# Patient Record
Sex: Male | Born: 1965
Health system: Southern US, Community
[De-identification: ages and names within clinical notes are randomized; demographics above are authoritative.]

## PROBLEM LIST (undated history)

## (undated) DIAGNOSIS — I10 Essential (primary) hypertension: Secondary | ICD-10-CM

## (undated) DIAGNOSIS — Z9889 Other specified postprocedural states: Secondary | ICD-10-CM

## (undated) DIAGNOSIS — Z8719 Personal history of other diseases of the digestive system: Secondary | ICD-10-CM

## (undated) HISTORY — PX: SHOULDER SURGERY: SHX246

---

## 1998-09-25 ENCOUNTER — Ambulatory Visit (HOSPITAL_COMMUNITY): Admission: RE | Admit: 1998-09-25 | Discharge: 1998-09-25 | Payer: Self-pay | Admitting: Neurosurgery

## 1998-09-25 ENCOUNTER — Encounter: Payer: Self-pay | Admitting: Neurosurgery

## 2011-08-14 ENCOUNTER — Other Ambulatory Visit (HOSPITAL_COMMUNITY): Payer: Self-pay | Admitting: Orthopedic Surgery

## 2011-08-14 DIAGNOSIS — M659 Synovitis and tenosynovitis, unspecified: Secondary | ICD-10-CM

## 2011-08-14 DIAGNOSIS — R52 Pain, unspecified: Secondary | ICD-10-CM

## 2011-08-19 ENCOUNTER — Other Ambulatory Visit (HOSPITAL_COMMUNITY): Payer: Self-pay

## 2011-08-22 ENCOUNTER — Inpatient Hospital Stay (HOSPITAL_COMMUNITY): Admission: RE | Admit: 2011-08-22 | Payer: Self-pay | Source: Ambulatory Visit

## 2011-09-01 ENCOUNTER — Inpatient Hospital Stay (HOSPITAL_COMMUNITY): Admission: RE | Admit: 2011-09-01 | Payer: Self-pay | Source: Ambulatory Visit

## 2011-09-04 ENCOUNTER — Other Ambulatory Visit (HOSPITAL_COMMUNITY): Payer: Self-pay | Admitting: Orthopedic Surgery

## 2011-09-04 DIAGNOSIS — M659 Synovitis and tenosynovitis, unspecified: Secondary | ICD-10-CM

## 2011-09-19 ENCOUNTER — Ambulatory Visit (HOSPITAL_COMMUNITY)
Admission: RE | Admit: 2011-09-19 | Discharge: 2011-09-19 | Disposition: A | Discharge status: Home / Self Care | Payer: 59 | Source: Ambulatory Visit | Attending: Orthopedic Surgery | Admitting: Orthopedic Surgery

## 2011-09-19 DIAGNOSIS — M6580 Other synovitis and tenosynovitis, unspecified site: Secondary | ICD-10-CM | POA: Insufficient documentation

## 2011-09-19 DIAGNOSIS — M19029 Primary osteoarthritis, unspecified elbow: Secondary | ICD-10-CM | POA: Insufficient documentation

## 2011-09-19 DIAGNOSIS — M659 Synovitis and tenosynovitis, unspecified: Secondary | ICD-10-CM

## 2013-09-16 ENCOUNTER — Other Ambulatory Visit: Payer: Self-pay | Admitting: Family Medicine

## 2013-09-16 DIAGNOSIS — M542 Cervicalgia: Secondary | ICD-10-CM

## 2013-09-23 ENCOUNTER — Other Ambulatory Visit: Payer: 59

## 2013-10-01 ENCOUNTER — Other Ambulatory Visit: Payer: 59

## 2014-01-21 ENCOUNTER — Ambulatory Visit
Admission: RE | Admit: 2014-01-21 | Discharge: 2014-01-21 | Disposition: A | Discharge status: Home / Self Care | Payer: BC Managed Care – PPO | Source: Ambulatory Visit | Attending: Family Medicine | Admitting: Family Medicine

## 2014-01-21 DIAGNOSIS — M542 Cervicalgia: Secondary | ICD-10-CM

## 2014-02-02 ENCOUNTER — Other Ambulatory Visit: Payer: Self-pay | Admitting: Neurosurgery

## 2014-02-02 DIAGNOSIS — M5412 Radiculopathy, cervical region: Secondary | ICD-10-CM

## 2014-02-06 ENCOUNTER — Ambulatory Visit
Admission: RE | Admit: 2014-02-06 | Discharge: 2014-02-06 | Disposition: A | Discharge status: Home / Self Care | Payer: BC Managed Care – PPO | Source: Ambulatory Visit | Attending: Neurosurgery | Admitting: Neurosurgery

## 2014-02-06 VITALS — BP 130/85 | HR 80

## 2014-02-06 DIAGNOSIS — M5412 Radiculopathy, cervical region: Secondary | ICD-10-CM

## 2014-02-06 MED ORDER — TRIAMCINOLONE ACETONIDE 40 MG/ML IJ SUSP (RADIOLOGY)
60.0000 mg | Freq: Once | INTRAMUSCULAR | Status: AC
  Administered 2014-02-06: 60 mg via EPIDURAL

## 2014-02-06 MED ORDER — IOHEXOL 300 MG/ML  SOLN
1.0000 mL | Freq: Once | INTRAMUSCULAR | Status: AC | PRN
  Administered 2014-02-06: 1 mL via EPIDURAL

## 2014-02-06 MED ORDER — DIAZEPAM 5 MG PO TABS
10.0000 mg | ORAL_TABLET | Freq: Once | ORAL | Status: AC
  Administered 2014-02-06: 10 mg via ORAL

## 2014-02-06 NOTE — Discharge Instructions (Signed)

## 2014-03-28 ENCOUNTER — Encounter (HOSPITAL_COMMUNITY): Payer: Self-pay | Admitting: Emergency Medicine

## 2014-03-28 ENCOUNTER — Emergency Department (HOSPITAL_COMMUNITY)
Admission: EM | Admit: 2014-03-28 | Discharge: 2014-03-28 | Disposition: A | Discharge status: Home / Self Care | Payer: BC Managed Care – PPO | Source: Home / Self Care | Attending: Family Medicine | Admitting: Family Medicine

## 2014-03-28 DIAGNOSIS — L600 Ingrowing nail: Secondary | ICD-10-CM

## 2014-03-28 DIAGNOSIS — IMO0002 Reserved for concepts with insufficient information to code with codable children: Secondary | ICD-10-CM

## 2014-03-28 MED ORDER — SULFAMETHOXAZOLE-TMP DS 800-160 MG PO TABS: 1.0000 | ORAL_TABLET | Freq: Two times a day (BID) | ORAL | Status: DC

## 2014-03-28 NOTE — Discharge Instructions (Signed)
Infected Ingrown Toenail  An infected ingrown toenail occurs when the nail edge grows into the skin and bacteria invade the area. Symptoms include pain, tenderness, swelling, and pus drainage from the edge of the nail. Poorly fitting shoes, minor injuries, and improper cutting of the toenail may also contribute to the problem. You should cut your toenails squarely instead of rounding the edges. Do not cut them too short. Avoid tight or pointed toe shoes. Sometimes the ingrown portion of the nail must be removed. If your toenail is removed, it can take 3-4 months for it to re-grow.  HOME CARE INSTRUCTIONS    Soak your infected toe in warm water for 20-30 minutes, 2 to 3 times a day.   Packing or dressings applied to the area should be changed daily.   Take medicine as directed and finish them.   Reduce activities and keep your foot elevated when able to reduce swelling and discomfort. Do this until the infection gets better.   Wear sandals or go barefoot as much as possible while the infected area is sensitive.   See your caregiver for follow-up care in 2-3 days if the infection is not better.  SEEK MEDICAL CARE IF:   Your toe is becoming more red, swollen or painful.  MAKE SURE YOU:    Understand these instructions.   Will watch your condition.   Will get help right away if you are not doing well or get worse.  Document Released: 01/01/2005 Document Revised: 02/16/2012 Document Reviewed: 11/20/2008  ExitCare Patient Information 2014 [employer].

## 2014-03-28 NOTE — ED Notes (Signed)
Placed telfa pad with bacitracin ointment on right great toe per provider orders.

## 2014-03-28 NOTE — ED Notes (Signed)
Patient complains of right tow pain and swelling for past week; states some blood, but denies purulence.

## 2014-03-28 NOTE — ED Provider Notes (Signed)
CSN: [id number]     Arrival date & time 03/28/14  0830 History   First MD Initiated Contact with Patient 03/28/14 [phone]     Chief Complaint  Patient presents with  . Toe Pain   (Consider location/radiation/quality/duration/timing/severity/associated sxs/prior Treatment) HPI Comments: [age]m presents c/o possible ingrown toenail of right first toe medial nail fold.  He has had increasing pain and some bloody drainage for the past week.  He has never had this before.  No meds taken for pain or treatments tried.  No other sxs.    Patient is a 48 y.o. male presenting with toe pain.  Toe Pain    History reviewed. No pertinent past medical history. Past Surgical History  Procedure Laterality Date  . Shoulder surgery Bilateral    No family history on file. History  Substance Use Topics  . Smoking status: Former Games developermoker  . Smokeless tobacco: Not on file  . Alcohol Use: No    Review of Systems  Musculoskeletal:       See HPI  All other systems reviewed and are negative.   Allergies  Prednisone  Home Medications   Prior to Admission medications   Medication Sig Start Date End Date Taking? Authorizing Provider  aspirin 81 MG tablet Take 81 mg by mouth daily.   Yes Historical Provider, MD  fenofibrate (TRICOR) 48 MG tablet Take 48 mg by mouth daily.   Yes Historical Provider, MD  meloxicam (MOBIC) 15 MG tablet Take 15 mg by mouth daily.   Yes Historical Provider, MD  sulfamethoxazole-trimethoprim (BACTRIM DS) 800-160 MG per tablet Take 1 tablet by mouth 2 (two) times daily. 03/28/14   Adrian BlackwaterZachary H Chael Urenda, PA-C   BP 136/85  Pulse 103  Temp(Src) 98.2 F (36.8 C) (Oral)  Resp 20  SpO2 98% Physical Exam  Nursing note and vitals reviewed. Constitutional: He is oriented to person, place, and time. He appears well-developed and well-nourished. No distress.  HENT:  Head: Normocephalic.  Pulmonary/Chest: Effort normal. No respiratory distress.  Musculoskeletal:        Feet:  Neurological: He is alert and oriented to person, place, and time. Coordination normal.  Skin: Skin is warm and dry. No rash noted. He is not diaphoretic.  Psychiatric: He has a normal mood and affect. Judgment normal.    ED Course  NAIL REMOVAL Date/Time: 03/28/2014 9:28 AM Performed by: Autumn MessingBAKER, Lacye Mccarn, H Authorized by: Bradd CanaryKINDL, JAMES D Consent: Verbal consent obtained. Risks and benefits: risks, benefits and alternatives were discussed Consent given by: patient Patient identity confirmed: verbally with patient Time out: Immediately prior to procedure a "time out" was called to verify the correct patient, procedure, equipment, support staff and site/side marked as required. Location: right foot Location details: right big toe Anesthesia: digital block Local anesthetic: lidocaine 2% without epinephrine Anesthetic total: 4 ml Patient sedated: no Preparation: skin prepped with Betadine Amount removed: partial Nail removed location: medial. Dressing: antibiotic ointment and 4x4 Patient tolerance: Patient tolerated the procedure well with no immediate complications.   (including critical care time) Labs Review Labs Reviewed - No data to display  No results found for this or any previous visit. Imaging Review No results found.   MDM   1. Ingrown toenail   2. Paronychia    Placing in bactrim for paronychia, f/u with podiatry if no improvement.    New Prescriptions   SULFAMETHOXAZOLE-TRIMETHOPRIM (BACTRIM DS) 800-160 MG PER TABLET    Take 1 tablet by mouth 2 (two) times daily.  Graylon GoodZachary H Valincia Touch, PA-C 03/28/14 [phone]

## 2014-03-31 NOTE — ED Provider Notes (Signed)
Medical screening examination/treatment/procedure(s) were performed by resident physician or non-physician practitioner and as supervising physician I was immediately available for consultation/collaboration.   KINDL,JAMES DOUGLAS MD.   James D Kindl, MD 03/31/14 1027 

## 2015-04-19 ENCOUNTER — Other Ambulatory Visit: Payer: Self-pay | Admitting: Neurosurgery

## 2015-04-19 DIAGNOSIS — M5020 Other cervical disc displacement, unspecified cervical region: Secondary | ICD-10-CM

## 2015-04-24 ENCOUNTER — Ambulatory Visit
Admission: RE | Admit: 2015-04-24 | Discharge: 2015-04-24 | Disposition: A | Discharge status: Home / Self Care | Payer: 59 | Source: Ambulatory Visit | Attending: Neurosurgery | Admitting: Neurosurgery

## 2015-04-24 DIAGNOSIS — M5020 Other cervical disc displacement, unspecified cervical region: Secondary | ICD-10-CM

## 2015-04-24 MED ORDER — IOHEXOL 300 MG/ML  SOLN
1.0000 mL | Freq: Once | INTRAMUSCULAR | Status: AC | PRN
  Administered 2015-04-24: 1 mL via EPIDURAL

## 2015-04-24 MED ORDER — TRIAMCINOLONE ACETONIDE 40 MG/ML IJ SUSP (RADIOLOGY)
60.0000 mg | Freq: Once | INTRAMUSCULAR | Status: AC
  Administered 2015-04-24: 60 mg via EPIDURAL

## 2015-04-24 NOTE — Discharge Instructions (Signed)

## 2015-05-18 ENCOUNTER — Other Ambulatory Visit: Payer: Self-pay | Admitting: Neurosurgery

## 2015-05-18 DIAGNOSIS — M542 Cervicalgia: Secondary | ICD-10-CM

## 2015-05-24 ENCOUNTER — Other Ambulatory Visit: Payer: 59

## 2016-04-18 ENCOUNTER — Other Ambulatory Visit: Payer: Self-pay | Admitting: Neurosurgery

## 2016-04-18 DIAGNOSIS — M4722 Other spondylosis with radiculopathy, cervical region: Secondary | ICD-10-CM

## 2016-04-24 ENCOUNTER — Other Ambulatory Visit: Payer: 59

## 2016-04-27 ENCOUNTER — Ambulatory Visit
Admission: RE | Admit: 2016-04-27 | Discharge: 2016-04-27 | Disposition: A | Discharge status: Home / Self Care | Payer: 59 | Source: Ambulatory Visit | Attending: Neurosurgery | Admitting: Neurosurgery

## 2016-04-27 DIAGNOSIS — M4722 Other spondylosis with radiculopathy, cervical region: Secondary | ICD-10-CM

## 2016-07-11 ENCOUNTER — Ambulatory Visit (HOSPITAL_BASED_OUTPATIENT_CLINIC_OR_DEPARTMENT_OTHER)
Admission: RE | Admit: 2016-07-11 | Discharge: 2016-07-11 | Disposition: A | Discharge status: Home / Self Care | Payer: 59 | Source: Ambulatory Visit | Attending: Physician Assistant | Admitting: Physician Assistant

## 2016-07-11 ENCOUNTER — Other Ambulatory Visit (HOSPITAL_BASED_OUTPATIENT_CLINIC_OR_DEPARTMENT_OTHER): Payer: Self-pay | Admitting: Physician Assistant

## 2016-07-11 DIAGNOSIS — R1030 Lower abdominal pain, unspecified: Secondary | ICD-10-CM

## 2016-07-11 DIAGNOSIS — R918 Other nonspecific abnormal finding of lung field: Secondary | ICD-10-CM | POA: Insufficient documentation

## 2016-07-11 DIAGNOSIS — I700 Atherosclerosis of aorta: Secondary | ICD-10-CM | POA: Insufficient documentation

## 2016-07-11 DIAGNOSIS — M545 Low back pain: Secondary | ICD-10-CM | POA: Insufficient documentation

## 2016-07-11 DIAGNOSIS — R109 Unspecified abdominal pain: Secondary | ICD-10-CM | POA: Insufficient documentation

## 2016-10-27 ENCOUNTER — Emergency Department (HOSPITAL_BASED_OUTPATIENT_CLINIC_OR_DEPARTMENT_OTHER): Payer: 59

## 2016-10-27 ENCOUNTER — Emergency Department (HOSPITAL_BASED_OUTPATIENT_CLINIC_OR_DEPARTMENT_OTHER)
Admission: EM | Admit: 2016-10-27 | Discharge: 2016-10-27 | Disposition: A | Discharge status: Home / Self Care | Payer: 59 | Attending: Emergency Medicine | Admitting: Emergency Medicine

## 2016-10-27 ENCOUNTER — Encounter (HOSPITAL_BASED_OUTPATIENT_CLINIC_OR_DEPARTMENT_OTHER): Payer: Self-pay | Admitting: *Deleted

## 2016-10-27 DIAGNOSIS — Z23 Encounter for immunization: Secondary | ICD-10-CM | POA: Insufficient documentation

## 2016-10-27 DIAGNOSIS — S51812A Laceration without foreign body of left forearm, initial encounter: Secondary | ICD-10-CM | POA: Diagnosis not present

## 2016-10-27 DIAGNOSIS — Z7982 Long term (current) use of aspirin: Secondary | ICD-10-CM | POA: Diagnosis not present

## 2016-10-27 DIAGNOSIS — S63287A Dislocation of proximal interphalangeal joint of left little finger, initial encounter: Secondary | ICD-10-CM | POA: Diagnosis not present

## 2016-10-27 DIAGNOSIS — Y999 Unspecified external cause status: Secondary | ICD-10-CM | POA: Diagnosis not present

## 2016-10-27 DIAGNOSIS — Z79899 Other long term (current) drug therapy: Secondary | ICD-10-CM | POA: Diagnosis not present

## 2016-10-27 DIAGNOSIS — Y929 Unspecified place or not applicable: Secondary | ICD-10-CM | POA: Diagnosis not present

## 2016-10-27 DIAGNOSIS — F17210 Nicotine dependence, cigarettes, uncomplicated: Secondary | ICD-10-CM | POA: Insufficient documentation

## 2016-10-27 DIAGNOSIS — S6992XA Unspecified injury of left wrist, hand and finger(s), initial encounter: Secondary | ICD-10-CM | POA: Diagnosis present

## 2016-10-27 DIAGNOSIS — Y9389 Activity, other specified: Secondary | ICD-10-CM | POA: Diagnosis not present

## 2016-10-27 DIAGNOSIS — W010XXA Fall on same level from slipping, tripping and stumbling without subsequent striking against object, initial encounter: Secondary | ICD-10-CM | POA: Diagnosis not present

## 2016-10-27 DIAGNOSIS — S41112A Laceration without foreign body of left upper arm, initial encounter: Secondary | ICD-10-CM

## 2016-10-27 DIAGNOSIS — S63259A Unspecified dislocation of unspecified finger, initial encounter: Secondary | ICD-10-CM

## 2016-10-27 MED ORDER — IBUPROFEN 800 MG PO TABS: 800.0000 mg | ORAL_TABLET | Freq: Three times a day (TID) | ORAL | 0 refills | Status: AC

## 2016-10-27 MED ORDER — TETANUS-DIPHTH-ACELL PERTUSSIS 5-2.5-18.5 LF-MCG/0.5 IM SUSP
0.5000 mL | Freq: Once | INTRAMUSCULAR | Status: AC
  Administered 2016-10-27: 0.5 mL via INTRAMUSCULAR
  Filled 2016-10-27: qty 0.5

## 2016-10-27 MED ORDER — IBUPROFEN 800 MG PO TABS
800.0000 mg | ORAL_TABLET | Freq: Once | ORAL | Status: AC
  Administered 2016-10-27: 800 mg via ORAL
  Filled 2016-10-27: qty 1

## 2016-10-27 MED FILL — IBUPROFEN 800 MG TABLET: 800 | 7 days supply | Qty: 21 | Fill #0

## 2016-10-27 NOTE — ED Provider Notes (Signed)
[facility]   CSN: [id number] Arrival date & time: 10/27/16  1605 By signing my name below, I, Donald Rice, attest that this documentation has been prepared under the direction and in the presence of Donald BarretteMarcy Rithik Odea, MD. Electronically Signed: Bridgette HabermannMaria Rice, ED Scribe. 10/27/16. 4:51 PM.  History   Chief Complaint Chief Complaint  Patient presents with  . Hand Injury   HPI Comments: Donald Rice is a 50 y.o. male with no pertinent PMHx, who presents to the Emergency Department complaining of left arm and left pinky laceration s/p mechanical injury one hour ago. Pt states he was stepping down on a palette and slipped and fell, breaking his fall with the left arm outstretched behind him. He thinks his 5th digit is broken. No LOC. Pt denies head injury. Bleeding controlled PTA. Pt has not taken any OTC medications PTA. He denies any additional injuries. Pt is not on blood thinners. Pt further denies fever, chills, or any other associated symptoms.   The history is provided by the patient. No language interpreter was used.    History reviewed. No pertinent past medical history.  There are no active problems to display for this patient.   Past Surgical History:  Procedure Laterality Date  . SHOULDER SURGERY Bilateral        Home Medications    Prior to Admission medications   Medication Sig Start Date End Date Taking? Authorizing Provider  buPROPion (WELLBUTRIN XL) 150 MG 24 hr tablet Take 150 mg by mouth daily.   Yes Historical Provider, MD  oxyCODONE-acetaminophen (PERCOCET/ROXICET) 5-325 MG tablet Take by mouth every 4 (four) hours as needed for severe pain.   Yes Historical Provider, MD  aspirin 81 MG tablet Take 81 mg by mouth daily.    Historical Provider, MD  ibuprofen (ADVIL,MOTRIN) 800 MG tablet Take 1 tablet (800 mg total) by mouth 3 (three) times daily. 10/27/16   Donald BarretteMarcy Tavin Vernet, MD  meloxicam (MOBIC) 15 MG tablet Take 15 mg by mouth daily.     Historical Provider, MD    Family History History reviewed. No pertinent family history.  Social History Social History  Substance Use Topics  . Smoking status: Current Every Day Smoker    Types: Cigarettes  . Smokeless tobacco: Never Used  . Alcohol use No     Allergies   Prednisone   Review of Systems Review of Systems  Constitutional: Negative for chills and fever.  Skin: Positive for wound.  All other systems reviewed and are negative.    Physical Exam Updated Vital Signs BP (!) 154/102 (BP Location: Left Arm)   Pulse 98   Temp 98 F (36.7 C) (Oral)   Resp 18   Ht 5\' 10"  (1.778 m)   Wt 230 lb (104.3 kg)   SpO2 98%   BMI 33.00 kg/m   Physical Exam  Constitutional: He appears well-developed and well-nourished. No distress.  HENT:  Head: Normocephalic and atraumatic.  Eyes: Conjunctivae and EOM are normal.  Pulmonary/Chest: Effort normal. No respiratory distress.  Abdominal: He exhibits no distension.  Musculoskeletal: Normal range of motion. He exhibits tenderness.  Fifth digit dorsally displaced at PIP. Puncture to volar aspect of fifth digit, 2 mm. Superficial laceration volar aspect left forearm. 2x2 cm right angle. Each leg of this wound is superficial with approximately 3mm laceration into deep dermis at the apex. No gapping.  Neurological: He is alert.  Skin: Skin is warm and dry.  Psychiatric: He has a normal mood and affect.  His behavior is normal.  Nursing note and vitals reviewed.    ED Treatments / Results  DIAGNOSTIC STUDIES: Oxygen Saturation is 98% on RA, normal by my interpretation.    COORDINATION OF CARE: 4:50 PM Discussed treatment plan with pt at bedside which includes wound care and splitn and pt agreed to plan.  Labs (all labs ordered are listed, but only abnormal results are displayed) Labs Reviewed - No data to display  EKG  EKG Interpretation None       Radiology Dg Finger Little Left  Result Date:  10/27/2016 CLINICAL DATA:  Fall today with fifth digit injury and obvious deformity, initial encounter EXAM: LEFT LITTLE FINGER 2+V COMPARISON:  None. FINDINGS: There is posterior dislocation of the fifth middle phalanx with respect to the fifth proximal phalanx. A few small tiny bony densities are identified laterally adjacent to the joint which may represent small avulsions. No other focal abnormality is seen. IMPRESSION: Dislocation of the fifth PIP joint as described. Electronically Signed   By: Alcide CleverMark  Lukens M.D.   On: 10/27/2016 16:35    Procedures Reduction of dislocation Date/Time: 10/27/2016 5:14 PM Performed by: Donald BarrettePFEIFFER, Dodge Ator Authorized by: Donald BarrettePFEIFFER, Floy Riegler  Consent: Verbal consent obtained. Consent given by: patient Patient understanding: patient states understanding of the procedure being performed Patient identity confirmed: verbally with patient Local anesthesia used: no  Anesthesia: Local anesthesia used: no  Sedation: Patient sedated: no Patient tolerance: Patient tolerated the procedure well with no immediate complications Comments: Patient with posterior dislocation of the fifth digit at the PIP. This was reduced with quick traction and realignment. Patient tolerated very well.    (including critical care time) Wound cleansing and dressing; I extensively cleansed minor puncture and superficial abrasion wound on the forearm with SAF Clense. Wound was subsequently dressed with bacitracin and dressing. Minor puncture on volar aspect of fifth digit was also cleansed with extensive pressure spray SAf Clense. Bacitracin and dressing applied. Medications Ordered in ED Medications  Tdap (BOOSTRIX) injection 0.5 mL (0.5 mLs Intramuscular Given 10/27/16 1715)  ibuprofen (ADVIL,MOTRIN) tablet 800 mg (800 mg Oral Given 10/27/16 1714)     Initial Impression / Assessment and Plan / ED Course  I have reviewed the triage vital signs and the nursing notes.  Pertinent labs &  imaging results that were available during my care of the patient were reviewed by me and considered in my medical decision making (see chart for details).  Clinical Course     Final Clinical Impressions(s) / ED Diagnoses   Final diagnoses:  Finger dislocation, initial encounter  Laceration of left upper extremity, initial encounter   Isolated injuries from mechanical fall to left upper extremity. PIP dislocation reduced and splinted. Minor lacerations extensively cleansed and dressed. Patient is advised on necessity for orthopedic follow-up for further evaluation of possible ligamentous and tendinous disruption from dislocation. Patient made aware of possible instability of the joint if follow-up is not pursued. New Prescriptions New Prescriptions   IBUPROFEN (ADVIL,MOTRIN) 800 MG TABLET    Take 1 tablet (800 mg total) by mouth 3 (three) times daily.       Donald BarretteMarcy Alixandrea Milleson, MD 10/27/16 [phone]

## 2016-10-27 NOTE — ED Triage Notes (Signed)
Pt c/o left hand  Injury/ lac  x 1 hr ago

## 2016-10-27 NOTE — ED Notes (Signed)
ED Provider at bedside. 

## 2017-01-30 ENCOUNTER — Other Ambulatory Visit: Payer: Self-pay | Admitting: Family Medicine

## 2017-01-30 DIAGNOSIS — R911 Solitary pulmonary nodule: Secondary | ICD-10-CM

## 2017-03-18 ENCOUNTER — Ambulatory Visit (HOSPITAL_BASED_OUTPATIENT_CLINIC_OR_DEPARTMENT_OTHER)
Admission: RE | Admit: 2017-03-18 | Discharge: 2017-03-18 | Disposition: A | Discharge status: Home / Self Care | Payer: 59 | Source: Ambulatory Visit | Attending: Family Medicine | Admitting: Family Medicine

## 2017-03-18 DIAGNOSIS — R918 Other nonspecific abnormal finding of lung field: Secondary | ICD-10-CM | POA: Insufficient documentation

## 2017-03-18 DIAGNOSIS — R911 Solitary pulmonary nodule: Secondary | ICD-10-CM | POA: Diagnosis present

## 2017-03-18 DIAGNOSIS — N281 Cyst of kidney, acquired: Secondary | ICD-10-CM | POA: Diagnosis not present

## 2017-03-18 DIAGNOSIS — M5134 Other intervertebral disc degeneration, thoracic region: Secondary | ICD-10-CM | POA: Diagnosis not present

## 2017-09-14 IMAGING — CT CT RENAL STONE PROTOCOL
2 of 4 series · 15 of 46 positions shown, 17 images · non-contrast
Comparison: None.

CLINICAL DATA: Left flank pain for 2 weeks, worse with lying down.
No hematuria.

EXAM:
CT ABDOMEN AND PELVIS WITHOUT CONTRAST
TECHNIQUE: Multidetector CT imaging of the abdomen and pelvis was performed
following the standard protocol without IV contrast.

[Series 2: axial st · axial · 0.98mm/px · z∈[-545,-50]mm · 12 of 113 slices shown, 14 images]
[im 9/113  soft-tissue]
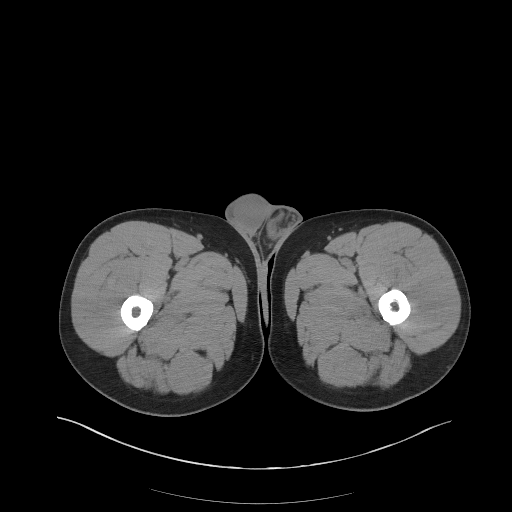
[im 9/113  bone]
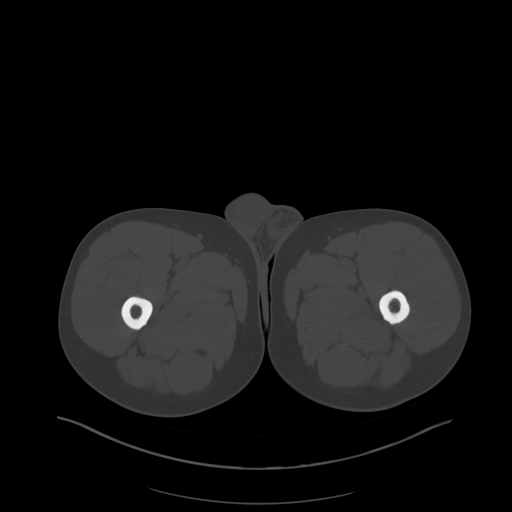
[im 18/113  soft-tissue]
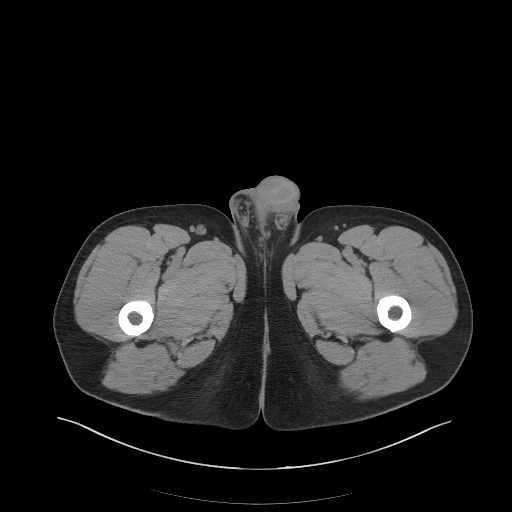
[im 27/113  soft-tissue]
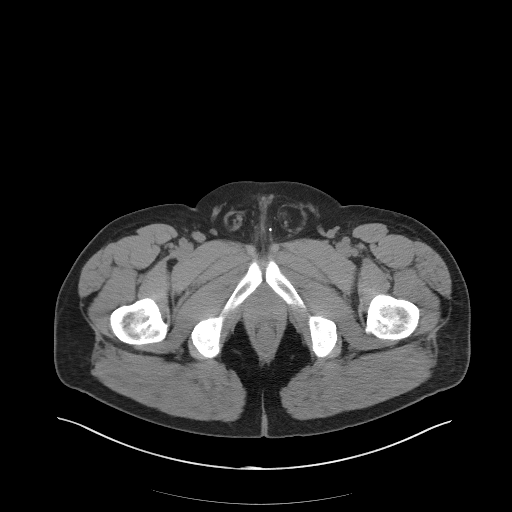
[im 36/113  soft-tissue]
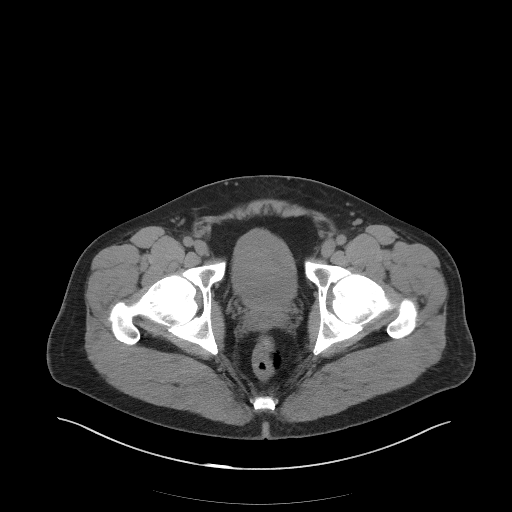
[im 45/113  soft-tissue]
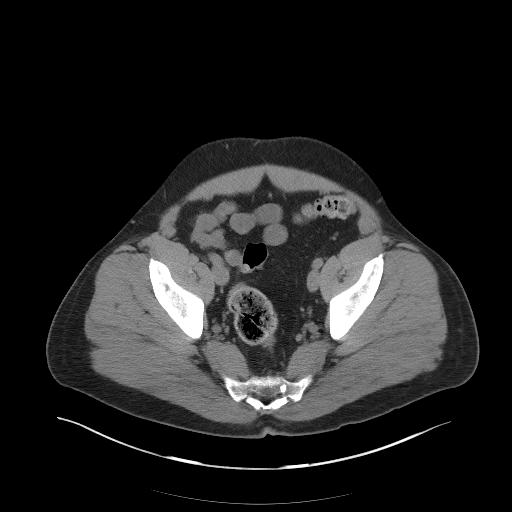
[im 54/113  soft-tissue]
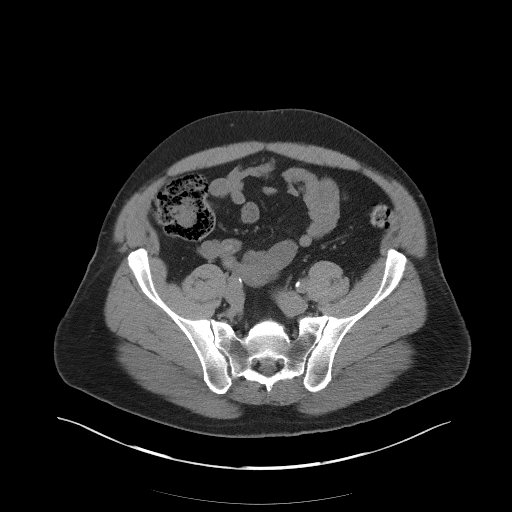
[im 63/113  soft-tissue]
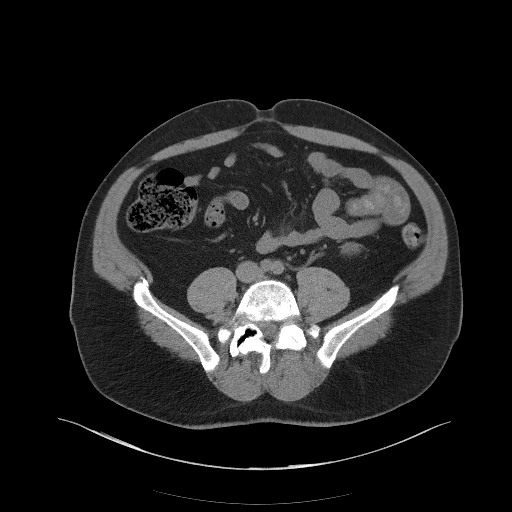
[im 72/113  soft-tissue]
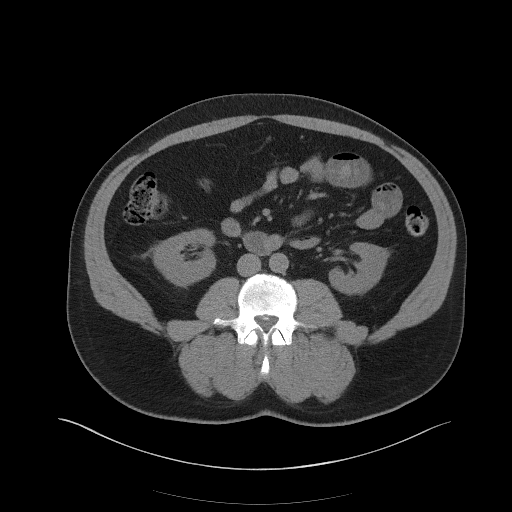
[im 81/113  soft-tissue]
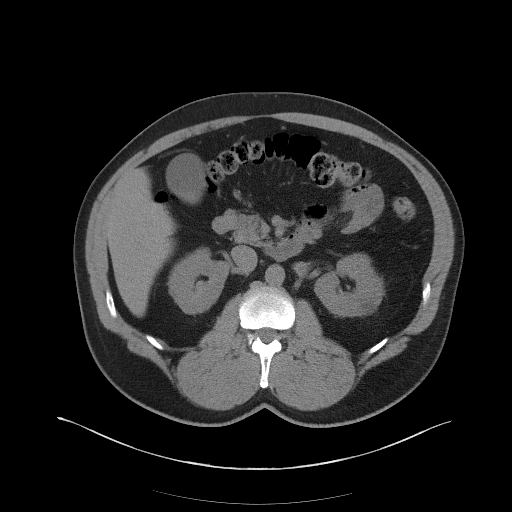
[im 81/113  bone]
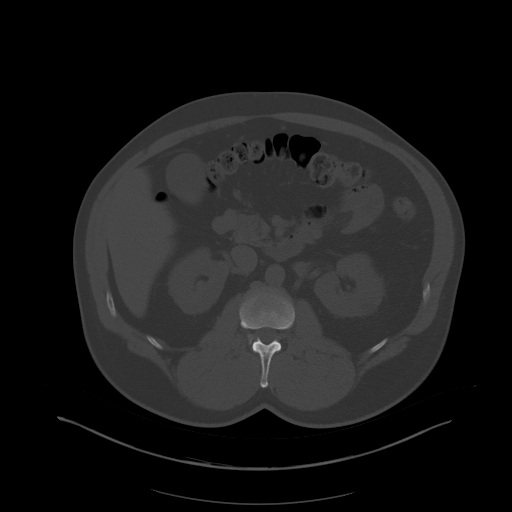
[im 90/113  soft-tissue]
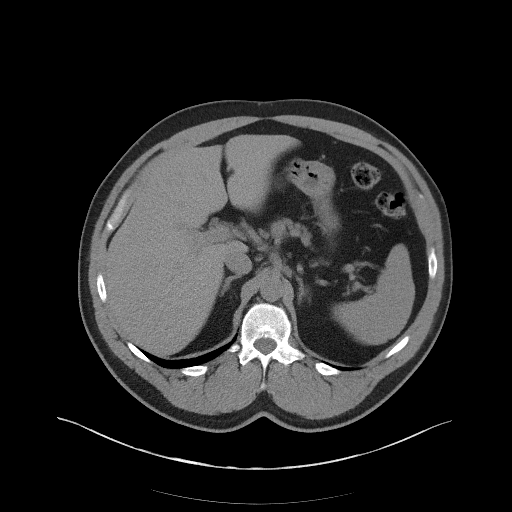
[im 99/113  soft-tissue]
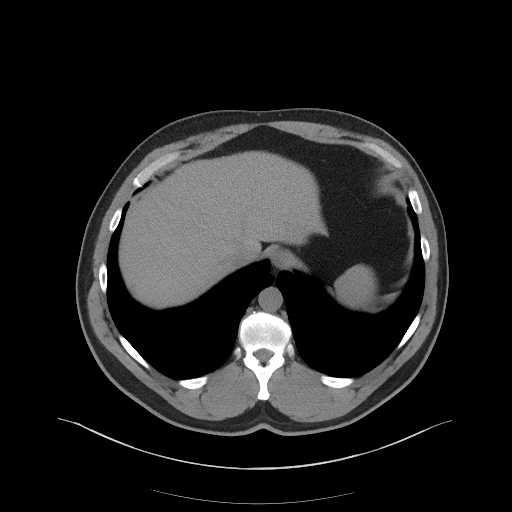
[im 108/113  soft-tissue]
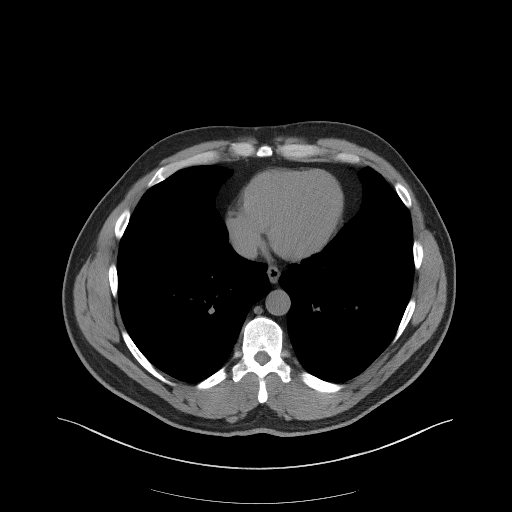

[Series 4: coronal st · coronal · 0.92mm/px · 3 of 124 slices shown]
[im 42/124  soft-tissue]
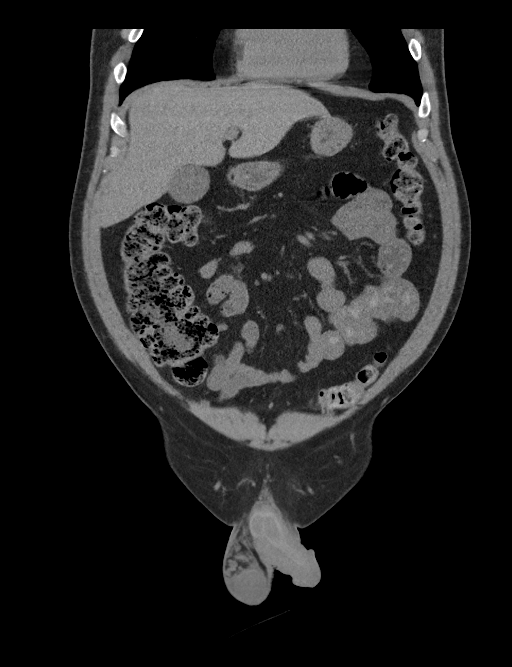
[im 55/124  soft-tissue]
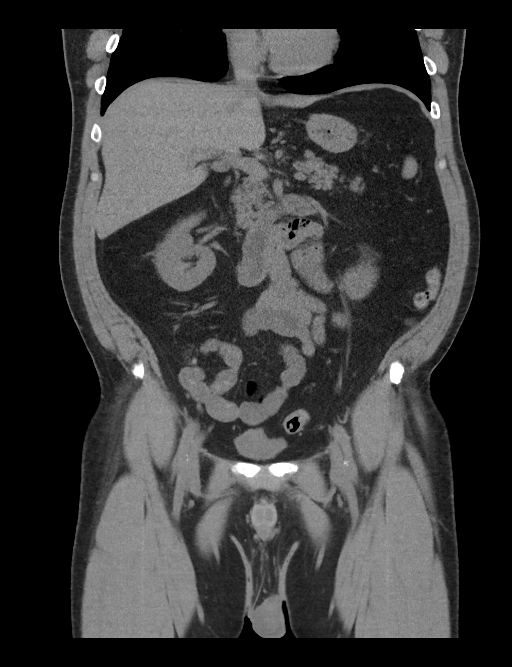
[im 69/124  soft-tissue]
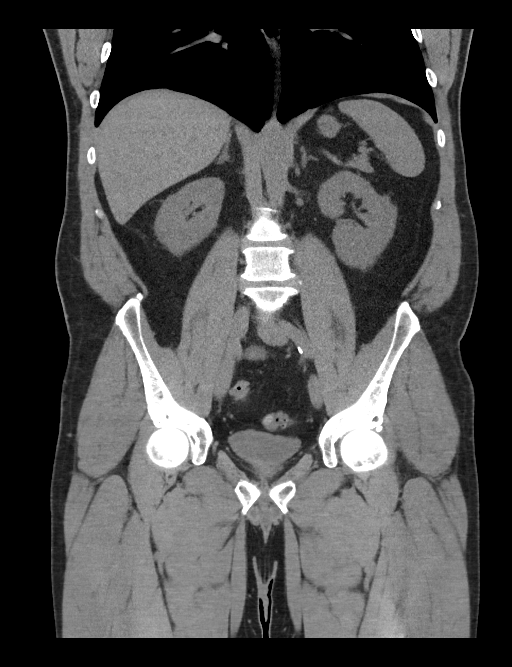

[15 of 46 positions shown; findings below may reference images not displayed]

FINDINGS: There is a nodule in the lateral right lung on series 3, image 2
measuring 6 mm and a nodule in the right base on series 3, image 10
measuring 3.5 mm. The lung bases are otherwise within normal limits.

No free air or free fluid. No renal stones, significant perinephric
stranding, or hydronephrosis. There is a cyst in the upper left
kidney. No ureterectasis or ureteral stones. Incidentally, there is
an accessory left renal artery entering via the lower pole.

Subtle high attenuation in the dependent portion the gallbladder
suggesting sludge or perhaps stones. The gallbladder is otherwise
normal in appearance. The liver, spleen, adrenal gland, and pancreas
are normal. The abdominal aorta demonstrates minimal atherosclerotic
change. No aneurysm. No adenopathy. The small bowel is unremarkable.
A short region narrowing in the distal sigmoid colon is thought to
be due to peristalsis. The colon is normal. Visualized portions of
the appendix are normal as well.

No adenopathy or mass in the pelvis. The bladder is normal. The
prostate and seminal vesicles are unremarkable. Phleboliths are
identified.

There are degenerative changes in the facets of the lower lumbar
spine, particularly to the right. Scattered degenerative disc
disease in the lumbar spine including small anterior osteophytes,
vacuum disc phenomena at L4-5, and a posterior disc osteophyte
complex at L3-4.
IMPRESSION: 1. No acute cause for the patient's symptoms identified. No renal
stones or obstruction.
2. 2 nodules in the right lung with the largest measuring 6 mm. See
below for follow-up recommendations.
3. Probable sludge and perhaps stones in the gallbladder.
4. Minimal atherosclerotic change in the abdominal aorta.
5. Degenerative changes in the spine.
Non-contrast chest CT at 3-6 months is recommended. If the nodules
are stable at time of repeat CT, then future CT at 18-24 months
(from today's scan) is considered optional for low-risk patients,
but is recommended for high-risk patients. This recommendation
follows the consensus statement: Guidelines for Management of
Incidental Pulmonary Nodules Detected on CT Images:From the
[HOSPITAL] 1544; published online before print
(10.1148/radiol.5897909038).

## 2018-06-15 DIAGNOSIS — G8929 Other chronic pain: Secondary | ICD-10-CM | POA: Diagnosis not present

## 2018-06-15 DIAGNOSIS — E782 Mixed hyperlipidemia: Secondary | ICD-10-CM | POA: Diagnosis not present

## 2018-06-15 DIAGNOSIS — I10 Essential (primary) hypertension: Secondary | ICD-10-CM | POA: Diagnosis not present

## 2018-06-15 DIAGNOSIS — M545 Low back pain: Secondary | ICD-10-CM | POA: Diagnosis not present

## 2018-09-01 DIAGNOSIS — M545 Low back pain: Secondary | ICD-10-CM | POA: Diagnosis not present

## 2018-09-01 DIAGNOSIS — E782 Mixed hyperlipidemia: Secondary | ICD-10-CM | POA: Diagnosis not present

## 2018-09-01 DIAGNOSIS — R739 Hyperglycemia, unspecified: Secondary | ICD-10-CM | POA: Diagnosis not present

## 2018-09-01 DIAGNOSIS — I10 Essential (primary) hypertension: Secondary | ICD-10-CM | POA: Diagnosis not present

## 2018-09-11 ENCOUNTER — Emergency Department (HOSPITAL_COMMUNITY): Payer: BLUE CROSS/BLUE SHIELD

## 2018-09-11 ENCOUNTER — Other Ambulatory Visit: Payer: Self-pay

## 2018-09-11 ENCOUNTER — Encounter (HOSPITAL_COMMUNITY): Payer: Self-pay | Admitting: Emergency Medicine

## 2018-09-11 ENCOUNTER — Inpatient Hospital Stay (HOSPITAL_COMMUNITY)
Admission: EM | Admit: 2018-09-11 | Discharge: 2018-09-13 | DRG: 419 | Disposition: A | Discharge status: Home / Self Care | Payer: BLUE CROSS/BLUE SHIELD | Attending: Family Medicine | Admitting: Family Medicine

## 2018-09-11 DIAGNOSIS — Z79899 Other long term (current) drug therapy: Secondary | ICD-10-CM | POA: Diagnosis not present

## 2018-09-11 DIAGNOSIS — Z7982 Long term (current) use of aspirin: Secondary | ICD-10-CM | POA: Diagnosis not present

## 2018-09-11 DIAGNOSIS — Z79891 Long term (current) use of opiate analgesic: Secondary | ICD-10-CM | POA: Diagnosis not present

## 2018-09-11 DIAGNOSIS — R0789 Other chest pain: Secondary | ICD-10-CM | POA: Diagnosis not present

## 2018-09-11 DIAGNOSIS — F329 Major depressive disorder, single episode, unspecified: Secondary | ICD-10-CM | POA: Diagnosis not present

## 2018-09-11 DIAGNOSIS — I10 Essential (primary) hypertension: Secondary | ICD-10-CM | POA: Diagnosis present

## 2018-09-11 DIAGNOSIS — K8066 Calculus of gallbladder and bile duct with acute and chronic cholecystitis without obstruction: Secondary | ICD-10-CM | POA: Diagnosis not present

## 2018-09-11 DIAGNOSIS — R1013 Epigastric pain: Secondary | ICD-10-CM

## 2018-09-11 DIAGNOSIS — K8062 Calculus of gallbladder and bile duct with acute cholecystitis without obstruction: Secondary | ICD-10-CM | POA: Diagnosis not present

## 2018-09-11 DIAGNOSIS — K8000 Calculus of gallbladder with acute cholecystitis without obstruction: Secondary | ICD-10-CM | POA: Diagnosis not present

## 2018-09-11 DIAGNOSIS — K8012 Calculus of gallbladder with acute and chronic cholecystitis without obstruction: Secondary | ICD-10-CM | POA: Diagnosis not present

## 2018-09-11 DIAGNOSIS — F17210 Nicotine dependence, cigarettes, uncomplicated: Secondary | ICD-10-CM | POA: Diagnosis not present

## 2018-09-11 DIAGNOSIS — R079 Chest pain, unspecified: Secondary | ICD-10-CM | POA: Diagnosis not present

## 2018-09-11 DIAGNOSIS — K8050 Calculus of bile duct without cholangitis or cholecystitis without obstruction: Secondary | ICD-10-CM

## 2018-09-11 DIAGNOSIS — K429 Umbilical hernia without obstruction or gangrene: Secondary | ICD-10-CM | POA: Diagnosis not present

## 2018-09-11 DIAGNOSIS — Z888 Allergy status to other drugs, medicaments and biological substances status: Secondary | ICD-10-CM

## 2018-09-11 DIAGNOSIS — K8020 Calculus of gallbladder without cholecystitis without obstruction: Secondary | ICD-10-CM | POA: Diagnosis not present

## 2018-09-11 LAB — CBC
HEMATOCRIT: 47.3 % (ref 39.0–52.0)
Hemoglobin: 15.5 g/dL (ref 13.0–17.0)
MCH: 28.4 pg (ref 26.0–34.0)
MCHC: 32.8 g/dL (ref 30.0–36.0)
MCV: 86.6 fL (ref 78.0–100.0)
PLATELETS: 267 10*3/uL (ref 150–400)
RBC: 5.46 MIL/uL (ref 4.22–5.81)
RDW: 12.5 % (ref 11.5–15.5)
WBC: 9.6 10*3/uL (ref 4.0–10.5)

## 2018-09-11 LAB — HEPATIC FUNCTION PANEL
ALBUMIN: 4 g/dL (ref 3.5–5.0)
ALT: 24 U/L (ref 0–44)
AST: 23 U/L (ref 15–41)
Alkaline Phosphatase: 60 U/L (ref 38–126)
BILIRUBIN TOTAL: 0.7 mg/dL (ref 0.3–1.2)
Bilirubin, Direct: 0.2 mg/dL (ref 0.0–0.2)
Indirect Bilirubin: 0.5 mg/dL (ref 0.3–0.9)
TOTAL PROTEIN: 6.8 g/dL (ref 6.5–8.1)

## 2018-09-11 LAB — I-STAT TROPONIN, ED
Troponin i, poc: 0 ng/mL (ref 0.00–0.08)
Troponin i, poc: 0 ng/mL (ref 0.00–0.08)

## 2018-09-11 LAB — BASIC METABOLIC PANEL
Anion gap: 8 (ref 5–15)
BUN: 19 mg/dL (ref 6–20)
CHLORIDE: 106 mmol/L (ref 98–111)
CO2: 25 mmol/L (ref 22–32)
Calcium: 9.6 mg/dL (ref 8.9–10.3)
Creatinine, Ser: 1.01 mg/dL (ref 0.61–1.24)
GFR calc non Af Amer: >60 mL/min (ref >60)
Glucose, Bld: 134 mg/dL — ABNORMAL HIGH (ref 70–99)
POTASSIUM: 3.5 mmol/L (ref 3.5–5.1)
Sodium: 139 mmol/L (ref 135–145)

## 2018-09-11 LAB — LIPASE, BLOOD: LIPASE: 47 U/L (ref 11–51)

## 2018-09-11 MED ORDER — ASPIRIN EC 81 MG PO TBEC
81.0000 mg | DELAYED_RELEASE_TABLET | Freq: Every day | ORAL | Status: DC
  Administered 2018-09-11 – 2018-09-13 (×3): 81 mg via ORAL
  Filled 2018-09-11 (×3): qty 1

## 2018-09-11 MED ORDER — FAMOTIDINE IN NACL 20-0.9 MG/50ML-% IV SOLN
20.0000 mg | Freq: Two times a day (BID) | INTRAVENOUS | Status: DC
  Administered 2018-09-11 – 2018-09-13 (×5): 20 mg via INTRAVENOUS
  Filled 2018-09-11 (×5): qty 50

## 2018-09-11 MED ORDER — MORPHINE SULFATE (PF) 4 MG/ML IV SOLN
4.0000 mg | Freq: Once | INTRAVENOUS | Status: AC
  Administered 2018-09-11: 4 mg via INTRAVENOUS
  Filled 2018-09-11: qty 1

## 2018-09-11 MED ORDER — CYCLOBENZAPRINE HCL 10 MG PO TABS: 10.0000 mg | ORAL_TABLET | Freq: Three times a day (TID) | ORAL | Status: DC | PRN

## 2018-09-11 MED ORDER — ENOXAPARIN SODIUM 60 MG/0.6ML ~~LOC~~ SOLN
50.0000 mg | SUBCUTANEOUS | Status: DC
  Administered 2018-09-11: 50 mg via SUBCUTANEOUS
  Filled 2018-09-11: qty 0.6

## 2018-09-11 MED ORDER — IOHEXOL 300 MG/ML  SOLN: 100.0000 mL | Freq: Once | INTRAMUSCULAR | Status: DC

## 2018-09-11 MED ORDER — PROMETHAZINE HCL 25 MG/ML IJ SOLN
12.5000 mg | Freq: Once | INTRAMUSCULAR | Status: AC
  Administered 2018-09-11: 12.5 mg via INTRAVENOUS
  Filled 2018-09-11: qty 1

## 2018-09-11 MED ORDER — MORPHINE SULFATE (PF) 2 MG/ML IV SOLN
2.0000 mg | INTRAVENOUS | Status: DC | PRN
  Administered 2018-09-11 – 2018-09-13 (×4): 2 mg via INTRAVENOUS
  Filled 2018-09-11 (×4): qty 1

## 2018-09-11 MED ORDER — LOSARTAN POTASSIUM 50 MG PO TABS
50.0000 mg | ORAL_TABLET | Freq: Every day | ORAL | Status: DC
  Administered 2018-09-11 – 2018-09-13 (×3): 50 mg via ORAL
  Filled 2018-09-11 (×3): qty 1

## 2018-09-11 MED ORDER — SODIUM CHLORIDE 0.9 % IV SOLN
INTRAVENOUS | Status: DC
  Administered 2018-09-11 – 2018-09-12 (×2): via INTRAVENOUS

## 2018-09-11 MED ORDER — IOHEXOL 300 MG/ML  SOLN
100.0000 mL | Freq: Once | INTRAMUSCULAR | Status: AC | PRN
  Administered 2018-09-11: 100 mL via INTRAVENOUS

## 2018-09-11 MED ORDER — BUPROPION HCL ER (XL) 150 MG PO TB24
150.0000 mg | ORAL_TABLET | Freq: Every day | ORAL | Status: DC
  Administered 2018-09-11 – 2018-09-13 (×3): 150 mg via ORAL
  Filled 2018-09-11 (×3): qty 1

## 2018-09-11 MED ORDER — SODIUM CHLORIDE 0.9 % IV SOLN
2.0000 g | Freq: Once | INTRAVENOUS | Status: AC
  Administered 2018-09-11: 2 g via INTRAVENOUS
  Filled 2018-09-11: qty 20

## 2018-09-11 MED ORDER — KETOROLAC TROMETHAMINE 30 MG/ML IJ SOLN
30.0000 mg | Freq: Four times a day (QID) | INTRAMUSCULAR | Status: DC | PRN
  Administered 2018-09-11 – 2018-09-13 (×5): 30 mg via INTRAVENOUS
  Filled 2018-09-11 (×5): qty 1

## 2018-09-11 NOTE — H&P (Signed)
History and Physical  Donald Rice ZOX:[id number] DOB: October 05, 1966 DOA: 09/11/2018  Referring physician: Dr. Judd Lien PCP: Joycelyn Rua, MD  Outpatient Specialists: None Patient coming from: Home & is able to ambulate yes  Chief Complaint: Sudden onset of epigastric pain about 12 midnight  HPI: Donald Rice is a 52 y.o. male with medical history significant for no significant history of past medical history except for hypertension and left shoulder pain for which she takes anti-inflammatory he presented to the emergency department with acute onset of epigastric pain that started about 12 midnight last night after eating his dinner at about 8:00.  He was associated with nausea and vomiting.  He denies consuming any spicy food, no shortness of breath or diaphoresis or radiation to the arm or jaw.  He describes it as sharp epigastric pain and is been constant.  He said he has not been able to keep anything down.  Denies any fever.   ED Course: He was given morphine x2 in the emergency department with Zofran IV  Review of Systems: Cardiovascular: Positive for chest pain Gastroenterology positive for nausea and vomiting denies any diarrhea or constipation Pt denies any He denies consuming any spicy food, no shortness of breath or diaphoresis or radiation to the arm or jaw. Denies any fever.     History reviewed. No pertinent past medical history. Past Surgical History:  Procedure Laterality Date  . SHOULDER SURGERY Bilateral     Social History:  reports that he has been smoking cigarettes. He has never used smokeless tobacco. He reports that he does not drink alcohol or use drugs.   Allergies  Allergen Reactions  . Prednisone Rash    PO route, only.  Can tolerate injections.      History reviewed. No pertinent family history.    Prior to Admission medications   Medication Sig Start Date End Date Taking? Authorizing Provider  aspirin 81 MG tablet Take 81 mg by mouth daily.   Yes  [provider]  buPROPion (WELLBUTRIN XL) 150 MG 24 hr tablet Take 150 mg by mouth daily.   Yes [provider]  cyclobenzaprine (FLEXERIL) 10 MG tablet Take 10 mg by mouth 3 (three) times daily as needed for muscle spasms.   Yes [provider]  losartan (COZAAR) 50 MG tablet Take 50 mg by mouth daily.   Yes [provider]  meloxicam (MOBIC) 15 MG tablet Take 15 mg by mouth daily.   Yes [provider]  ibuprofen (ADVIL,MOTRIN) 800 MG tablet Take 1 tablet (800 mg total) by mouth 3 (three) times daily. Patient not taking: Reported on 09/11/2018 10/27/16   Arby Barrette, MD    Physical Exam: BP (!) 144/89   Pulse 86   Temp 98.4 F (36.9 C) (Oral)   Resp 18   Ht 5\' 10"  (1.778 m)   Wt 104.3 kg   SpO2 95%   BMI 33.00 kg/m   General: Patient is alert and oriented x3 he is a mild painful distress Eyes: No jaundice no anterior PERRLA EOMI ENT: Nares are clear Neck: Supple Cardiovascular: Heart rate regular rate and rhythm with no murmur no edema pulses are palpable bilaterally Respiratory: Work of breathing is normal clear to auscultation in all lung fields no wheezing no rales Abdomen: Epigastric tenderness normoactive bowel sounds no hepatosplenomegaly Skin: Warm and dry no rashes Musculoskeletal: Ambulatory Psychiatric: Appropriate affect and mood Neurologic: Speech is clear he is alert oriented x3  Labs on Admission:  Basic Metabolic Panel: Recent Labs  Lab 09/11/18 0337  NA 139  K 3.5  CL 106  CO2 25  GLUCOSE 134*  BUN 19  CREATININE 1.01  CALCIUM 9.6   Liver Function Tests: Recent Labs  Lab 09/11/18 0447  AST 23  ALT 24  ALKPHOS 60  BILITOT 0.7  PROT 6.8  ALBUMIN 4.0   Recent Labs  Lab 09/11/18 0447  LIPASE 47   No results for input(s): AMMONIA in the last 168 hours. CBC: Recent Labs  Lab 09/11/18 0337  WBC 9.6  HGB 15.5  HCT 47.3  MCV 86.6  PLT 267   Cardiac Enzymes: No results for  input(s): CKTOTAL, CKMB, CKMBINDEX, TROPONINI in the last 168 hours.  BNP (last 3 results) No results for input(s): BNP in the last 8760 hours.  ProBNP (last 3 results) No results for input(s): PROBNP in the last 8760 hours.  CBG: No results for input(s): GLUCAP in the last 168 hours.  Radiological Exams on Admission: Dg Chest 2 View  Result Date: 09/11/2018 CLINICAL DATA:  52 y/o  M; chest pain starting tonight at midnight. EXAM: CHEST - 2 VIEW COMPARISON:  03/18/2017 chest CT FINDINGS: The heart size and mediastinal contours are within normal limits. Both lungs are clear. Mild S-shaped curvature of the spine. No acute osseous abnormality is evident. IMPRESSION: No acute pulmonary process identified. Electronically Signed   By: Mitzi Hansen M.D.   On: 09/11/2018 04:17   Ct Abdomen Pelvis W Contrast  Result Date: 09/11/2018 CLINICAL DATA:  Epigastric abdominal pain, nausea, vomiting EXAM: CT ABDOMEN AND PELVIS WITH CONTRAST TECHNIQUE: Multidetector CT imaging of the abdomen and pelvis was performed using the standard protocol following bolus administration of intravenous contrast. CONTRAST:  OMNIPAQUE IOHEXOL 300 MG/ML  SOLN COMPARISON:  Ultrasound earlier today.  CT 07/11/2016 FINDINGS: Lower chest: Minimal dependent atelectasis.  Is no effusions. Hepatobiliary: No focal hepatic abnormality. No biliary ductal dilatation. Gallstones noted in the gallbladder, better seen on today's ultrasound. Pancreas: No focal abnormality or ductal dilatation. Spleen: No focal abnormality.  Normal size. Adrenals/Urinary Tract: 2.3 cm cyst in the midpole of the left kidney. No hydronephrosis. Adrenal glands and urinary bladder unremarkable. Stomach/Bowel: Stomach, large and small bowel grossly unremarkable. Appendix not definitively seen. No inflammatory process in the right lower quadrant. Vascular/Lymphatic: No evidence of aneurysm or adenopathy. Reproductive: No visible focal abnormality.  Other: No free fluid or free air. Small left inguinal hernia containing fat. Small umbilical hernia containing fat. Musculoskeletal: No acute bony abnormality. Degenerative disc disease in the lower lumbar spine. IMPRESSION: Cholelithiasis, better seen on today's ultrasound. Small left inguinal hernia containing fat. No visible acute abnormality in the abdomen or pelvis. Electronically Signed   By: Charlett Nose M.D.   On: 09/11/2018 11:19   US Abdomen Limited Ruq  Result Date: 09/11/2018 CLINICAL DATA:  Epigastric pain. EXAM: ULTRASOUND ABDOMEN LIMITED RIGHT UPPER QUADRANT COMPARISON:  None. FINDINGS: Gallbladder: Physiologically distended containing multiple intraluminal gallstones. No gallbladder wall thickening or pericholecystic fluid. No sonographic Murphy sign noted by sonographer. Common bile duct: Diameter: 6 mm. Liver: No focal lesion identified. Within normal limits in parenchymal echogenicity. Portal vein is patent on color Doppler imaging with normal direction of blood flow towards the liver. IMPRESSION: Cholelithiasis without sonographic findings of acute cholecystitis. No biliary dilatation. Electronically Signed   By: Narda Rutherford M.D.   On: 09/11/2018 06:37    EKG: I Per ER physician  Assessment/Plan Present on Admission: .  Epigastric pain . Essential hypertension   1.  Epigastric pain we will start him on PPI 2.Biliary colic with cholelithiasis.  His ultrasound does not show any dilatation of the bile duct and his LFT are normal.  Gastroenterology was initially consulted but they deferred to surgery due to his normal LFT.  Will consult surgery 3.  Hypertension slightly poorly controlled we will continue losartan.    Principal Problem:   Epigastric pain Active Problems:   Gallstones   Essential hypertension   DVT prophylaxis: Lovenox  Code Status: Full code  Family Communication: None at bedside  Disposition Plan: Home when medically stable  Consults called:  Dr. Derrell Lolling surgery  Admission status: Inpatient    Myrtie Neither MD [facility] Pager [phone]  If 7PM-7AM, please contact night-coverage [email or web address] Password [email or web address]  09/11/2018, 12:11 PM

## 2018-09-11 NOTE — ED Triage Notes (Signed)
Pt reports chest pain starting tonight at midnight. No improvement with nitro - had one dose with EMS. 324 MG aspirin given, as well as 4mg  zofran.

## 2018-09-11 NOTE — ED Notes (Signed)
ED Provider at bedside. 

## 2018-09-11 NOTE — ED Provider Notes (Signed)
[facility] EMERGENCY DEPARTMENT Provider Note   CSN: [id number] Arrival date & time: 09/11/18  4098     History   Chief Complaint Chief Complaint  Patient presents with  . Chest Pain    HPI Donald Rice is a 52 y.o. male.  Patient is a 52 year old male with no significant past medical history.  He presents today for evaluation of epigastric pain and vomiting.  This came on abruptly at approximately midnight.  He denies having consumed any spicy or acidic foods.  He denies any shortness of breath, diaphoresis, or radiation to the arm or jaw.  The history is provided by the patient.  Chest Pain   This is a new problem. Episode onset: midnight. The problem occurs constantly. The problem has not changed since onset.The pain is present in the epigastric region. The pain is moderate. The quality of the pain is described as sharp. The pain does not radiate.    History reviewed. No pertinent past medical history.  There are no active problems to display for this patient.   Past Surgical History:  Procedure Laterality Date  . SHOULDER SURGERY Bilateral         Home Medications    Prior to Admission medications   Medication Sig Start Date End Date Taking? Authorizing Provider  aspirin 81 MG tablet Take 81 mg by mouth daily.    [provider]  buPROPion (WELLBUTRIN XL) 150 MG 24 hr tablet Take 150 mg by mouth daily.    [provider]  ibuprofen (ADVIL,MOTRIN) 800 MG tablet Take 1 tablet (800 mg total) by mouth 3 (three) times daily. 10/27/16   Arby Barrette, MD  meloxicam (MOBIC) 15 MG tablet Take 15 mg by mouth daily.    [provider]  oxyCODONE-acetaminophen (PERCOCET/ROXICET) 5-325 MG tablet Take by mouth every 4 (four) hours as needed for severe pain.    [provider]    Family History History reviewed. No pertinent family history.  Social History Social History   Tobacco Use  . Smoking status: Current  Every Day Smoker    Types: Cigarettes  . Smokeless tobacco: Never Used  Substance Use Topics  . Alcohol use: No  . Drug use: No     Allergies   Prednisone   Review of Systems Review of Systems  Cardiovascular: Positive for chest pain.  All other systems reviewed and are negative.    Physical Exam Updated Vital Signs BP (!) 173/107 (BP Location: Right Arm)   Pulse 69   Temp 98.4 F (36.9 C) (Oral)   Resp (!) 23   Ht 5\' 10"  (1.778 m)   Wt 104.3 kg   SpO2 99%   BMI 33.00 kg/m   Physical Exam  Constitutional: He is oriented to person, place, and time. He appears well-developed and well-nourished. No distress.  HENT:  Head: Normocephalic and atraumatic.  Mouth/Throat: Oropharynx is clear and moist.  Neck: Normal range of motion. Neck supple.  Cardiovascular: Normal rate and regular rhythm. Exam reveals no friction rub.  No murmur heard. Pulmonary/Chest: Effort normal and breath sounds normal. No respiratory distress. He has no wheezes. He has no rales.  Abdominal: Soft. Bowel sounds are normal. He exhibits no distension. There is no tenderness.  Musculoskeletal: Normal range of motion. He exhibits no edema.  Neurological: He is alert and oriented to person, place, and time. Coordination normal.  Skin: Skin is warm and dry. He is not diaphoretic.  Nursing note and vitals reviewed.  ED Treatments / Results  Labs (all labs ordered are listed, but only abnormal results are displayed) Labs Reviewed  BASIC METABOLIC PANEL - Abnormal; Notable for the following components:      Result Value   Glucose, Bld 134 (*)    All other components within normal limits  CBC  HEPATIC FUNCTION PANEL  LIPASE, BLOOD  I-STAT TROPONIN, ED    EKG EKG Interpretation  Date/Time:  Saturday September 11 2018 03:31:16 EDT Ventricular Rate:  70 PR Interval:    QRS Duration: 84 QT Interval:  402 QTC Calculation: 434 R Axis:   16 Text Interpretation:  Sinus rhythm Normal ECG  Confirmed by Geoffery Lyons ([id number]) on 09/11/2018 4:09:23 AM   Radiology Dg Chest 2 View  Result Date: 09/11/2018 CLINICAL DATA:  52 y/o  M; chest pain starting tonight at midnight. EXAM: CHEST - 2 VIEW COMPARISON:  03/18/2017 chest CT FINDINGS: The heart size and mediastinal contours are within normal limits. Both lungs are clear. Mild S-shaped curvature of the spine. No acute osseous abnormality is evident. IMPRESSION: No acute pulmonary process identified. Electronically Signed   By: Mitzi Hansen M.D.   On: 09/11/2018 04:17    Procedures Procedures (including critical care time)  Medications Ordered in ED Medications  morphine 4 MG/ML injection 4 mg (has no administration in time range)  promethazine (PHENERGAN) injection 12.5 mg (has no administration in time range)     Initial Impression / Assessment and Plan / ED Course  I have reviewed the triage vital signs and the nursing notes.  Pertinent labs & imaging results that were available during my care of the patient were reviewed by me and considered in my medical decision making (see chart for details).  Patient presents here with epigastric pain, nausea, and vomiting that started abruptly tonight at midnight.  He is tender in the epigastrium, however no peritoneal signs.  Laboratory studies are essentially unremarkable, but Ultrasound shows gallstones.  There is no evidence for obstruction or cholecystitis.  Patient is not feeling better after repeat doses of morphine and antiemetics.  He will be admitted to the hospitalist service for further work-up.  He may benefit from a HIDA scan.  Final Clinical Impressions(s) / ED Diagnoses   Final diagnoses:  Epigastric pain    ED Discharge Orders    None       Geoffery Lyons, MD 09/11/18 [phone]

## 2018-09-11 NOTE — ED Notes (Signed)
Patient transported to CT 

## 2018-09-11 NOTE — ED Notes (Signed)
No relief from morphine, MD notified

## 2018-09-11 NOTE — Plan of Care (Signed)
  Problem: Pain Managment: Goal: General experience of comfort will improve Outcome: Progressing   

## 2018-09-11 NOTE — Consult Note (Signed)
Reason for Consult: Abdominal pain Referring Physician: Dr. Odis Luster is an 52 y.o. male.  HPI: Patient is a 52 year old male with a history of hypertension, depression, who comes in with a 8-10hour history of epigastric abdominal pain.  Patient states that he had associated nausea vomiting.  He states that the pain continued and was unrelenting.  He states it was sharp and about the size of a baseball in his epigastrium.  Patient proceeded to the ER secondary to continued pain.  Patient was seen in the ER and underwent ultrasound, CT scan which revealed sludge and gallstones.  Patient had LFTs were within normal limits.  General surgery was consulted for further evaluation and management.  History reviewed. No pertinent past medical history.  Past Surgical History:  Procedure Laterality Date  . SHOULDER SURGERY Bilateral     History reviewed. No pertinent family history.  Social History:  reports that he has been smoking cigarettes. He has never used smokeless tobacco. He reports that he does not drink alcohol or use drugs.  Allergies:  Allergies  Allergen Reactions  . Prednisone Rash    PO route, only.  Can tolerate injections.      Medications: I have reviewed the patient's current medications.  Results for orders placed or performed during the hospital encounter of 09/11/18 (from the past 48 hour(s))  Basic metabolic panel     Status: Abnormal   Collection Time: 09/11/18  3:37 AM  Result Value Ref Range   Sodium 139 135 - 145 mmol/L   Potassium 3.5 3.5 - 5.1 mmol/L   Chloride 106 98 - 111 mmol/L   CO2 25 22 - 32 mmol/L   Glucose, Bld 134 (H) 70 - 99 mg/dL   BUN 19 6 - 20 mg/dL   Creatinine, Ser 1.01 0.61 - 1.24 mg/dL   Calcium 9.6 8.9 - 10.3 mg/dL   GFR calc non Af Amer >60 >60 mL/min   GFR calc Af Amer >60 >60 mL/min    Comment: (NOTE) The eGFR has been calculated using the CKD EPI equation. This calculation has not been validated in all clinical  situations. eGFR's persistently <60 mL/min signify possible Chronic Kidney Disease.    Anion gap 8 5 - 15    Comment: Performed at [facility] [address] [place] [address]  CBC     Status: None   Collection Time: 09/11/18  3:37 AM  Result Value Ref Range   WBC 9.6 4.0 - 10.5 K/uL   RBC 5.46 4.22 - 5.81 MIL/uL   Hemoglobin 15.5 13.0 - 17.0 g/dL   HCT 47.3 39.0 - 52.0 %   MCV 86.6 78.0 - 100.0 fL   MCH 28.4 26.0 - 34.0 pg   MCHC 32.8 30.0 - 36.0 g/dL   RDW 12.5 11.5 - 15.5 %   Platelets 267 150 - 400 K/uL    Comment: Performed at [facility], [place] [address] [place], [place] [address]  I-stat troponin, ED     Status: None   Collection Time: 09/11/18  3:41 AM  Result Value Ref Range   Troponin i, poc 0.00 0.00 - 0.08 ng/mL   Comment 3            Comment: Due to the release kinetics of cTnI, a negative result within the first hours of the onset of symptoms does not rule out myocardial infarction with certainty. If myocardial infarction is still suspected, repeat the test at appropriate intervals.  Hepatic function panel     Status: None   Collection Time: 09/11/18  4:47 AM  Result Value Ref Range   Total Protein 6.8 6.5 - 8.1 g/dL   Albumin 4.0 3.5 - 5.0 g/dL   AST 23 15 - 41 U/L   ALT 24 0 - 44 U/L   Alkaline Phosphatase 60 38 - 126 U/L   Total Bilirubin 0.7 0.3 - 1.2 mg/dL   Bilirubin, Direct 0.2 0.0 - 0.2 mg/dL   Indirect Bilirubin 0.5 0.3 - 0.9 mg/dL    Comment: Performed at [facility] [address], [place], [place] [address]  Lipase, blood     Status: None   Collection Time: 09/11/18  4:47 AM  Result Value Ref Range   Lipase 47 11 - 51 U/L    Comment: Performed at [facility] [address], [place], [place] [address]  I-stat troponin, ED     Status: None   Collection Time: 09/11/18  6:40 AM  Result Value Ref Range   Troponin i, poc 0.00 0.00 - 0.08 ng/mL   Comment 3            Comment: Due to the release kinetics  of cTnI, a negative result within the first hours of the onset of symptoms does not rule out myocardial infarction with certainty. If myocardial infarction is still suspected, repeat the test at appropriate intervals.     Dg Chest 2 View  Result Date: 09/11/2018 CLINICAL DATA:  52 y/o  M; chest pain starting tonight at midnight. EXAM: CHEST - 2 VIEW COMPARISON:  03/18/2017 chest CT FINDINGS: The heart size and mediastinal contours are within normal limits. Both lungs are clear. Mild S-shaped curvature of the spine. No acute osseous abnormality is evident. IMPRESSION: No acute pulmonary process identified. Electronically Signed   By: Kristine Garbe M.D.   On: 09/11/2018 04:17   Ct Abdomen Pelvis W Contrast  Result Date: 09/11/2018 CLINICAL DATA:  Epigastric abdominal pain, nausea, vomiting EXAM: CT ABDOMEN AND PELVIS WITH CONTRAST TECHNIQUE: Multidetector CT imaging of the abdomen and pelvis was performed using the standard protocol following bolus administration of intravenous contrast. CONTRAST:  129m OMNIPAQUE IOHEXOL 300 MG/ML  SOLN COMPARISON:  Ultrasound earlier today.  CT 07/11/2016 FINDINGS: Lower chest: Minimal dependent atelectasis.  Is no effusions. Hepatobiliary: No focal hepatic abnormality. No biliary ductal dilatation. Gallstones noted in the gallbladder, better seen on today's ultrasound. Pancreas: No focal abnormality or ductal dilatation. Spleen: No focal abnormality.  Normal size. Adrenals/Urinary Tract: 2.3 cm cyst in the midpole of the left kidney. No hydronephrosis. Adrenal glands and urinary bladder unremarkable. Stomach/Bowel: Stomach, large and small bowel grossly unremarkable. Appendix not definitively seen. No inflammatory process in the right lower quadrant. Vascular/Lymphatic: No evidence of aneurysm or adenopathy. Reproductive: No visible focal abnormality. Other: No free fluid or free air. Small left inguinal hernia containing fat. Small umbilical hernia  containing fat. Musculoskeletal: No acute bony abnormality. Degenerative disc disease in the lower lumbar spine. IMPRESSION: Cholelithiasis, better seen on today's ultrasound. Small left inguinal hernia containing fat. No visible acute abnormality in the abdomen or pelvis. Electronically Signed   By: KRolm BaptiseM.D.   On: 09/11/2018 11:19   UKoreaAbdomen Limited Ruq  Result Date: 09/11/2018 CLINICAL DATA:  Epigastric pain. EXAM: ULTRASOUND ABDOMEN LIMITED RIGHT UPPER QUADRANT COMPARISON:  None. FINDINGS: Gallbladder: Physiologically distended containing multiple intraluminal gallstones. No gallbladder wall thickening or pericholecystic fluid. No sonographic Murphy sign noted by sonographer.  Common bile duct: Diameter: 6 mm. Liver: No focal lesion identified. Within normal limits in parenchymal echogenicity. Portal vein is patent on color Doppler imaging with normal direction of blood flow towards the liver. IMPRESSION: Cholelithiasis without sonographic findings of acute cholecystitis. No biliary dilatation. Electronically Signed   By: Keith Rake M.D.   On: 09/11/2018 06:37    Review of Systems  Constitutional: Negative for chills, fever and malaise/fatigue.  HENT: Negative for ear discharge, hearing loss and sore throat.   Eyes: Negative for blurred vision and discharge.  Respiratory: Negative for cough and shortness of breath.   Cardiovascular: Negative for chest pain, orthopnea and leg swelling.  Gastrointestinal: Positive for abdominal pain, nausea and vomiting. Negative for constipation, diarrhea and heartburn.  Musculoskeletal: Negative for myalgias and neck pain.  Skin: Negative for itching and rash.  Neurological: Negative for dizziness, focal weakness, seizures and loss of consciousness.  Endo/Heme/Allergies: Negative for environmental allergies. Does not bruise/bleed easily.  Psychiatric/Behavioral: Negative for depression and suicidal ideas.  All other systems reviewed and are  negative.  Blood pressure (!) 166/102, pulse (!) 59, temperature 97.9 F (36.6 C), temperature source Oral, resp. rate 18, height '5\' 10"'$  (1.778 m), weight 104.3 kg, SpO2 98 %. Physical Exam  Constitutional: He is oriented to person, place, and time. Vital signs are normal. He appears well-developed and well-nourished.  Conversant No acute distress  HENT:  Head: Normocephalic and atraumatic.  Eyes: Pupils are equal, round, and reactive to light. Conjunctivae and lids are normal. No scleral icterus.  No lid lag Moist conjunctiva  Neck: Normal range of motion. Neck supple. No tracheal tenderness present. No thyromegaly present.  No cervical lymphadenopathy  Cardiovascular: Normal rate, regular rhythm and intact distal pulses.  No murmur heard. Respiratory: Effort normal and breath sounds normal. He has no wheezes. He has no rales.  GI: Soft. Bowel sounds are normal. He exhibits no distension. There is no hepatosplenomegaly. There is tenderness (Epigastrum/RUQ). There is no rebound and no guarding. No hernia.  Neurological: He is alert and oriented to person, place, and time.  Normal gait and station  Skin: Skin is warm. No rash noted. No cyanosis. Nails show no clubbing.  Normal skin turgor  Psychiatric: Judgment normal.  Appropriate affect    Assessment/Plan: 52 year old male with likely acute cholecystitis. HTN Depression  1.  Okay for liquids today, keep n.p.o. after midnight 2.  Patient to the OR for lap chole by Dr. Ninfa Linden 3. All risks and benefits were discussed with the patient to generally include: infection, bleeding, possible need for post op ERCP, damage to the bile ducts, and bile leak. Alternatives were offered and described.  All questions were answered and the patient voiced understanding of the procedure and wishes to proceed at this point with a laparoscopic cholecystectomy   Ralene Ok 09/11/2018, 5:51 PM

## 2018-09-12 ENCOUNTER — Encounter (HOSPITAL_COMMUNITY)
Admission: EM | Disposition: A | Discharge status: Home / Self Care | Payer: Self-pay | Source: Home / Self Care | Attending: Family Medicine

## 2018-09-12 ENCOUNTER — Encounter (HOSPITAL_COMMUNITY): Payer: Self-pay | Admitting: Surgery

## 2018-09-12 ENCOUNTER — Inpatient Hospital Stay (HOSPITAL_COMMUNITY): Payer: BLUE CROSS/BLUE SHIELD | Admitting: Certified Registered"

## 2018-09-12 DIAGNOSIS — K8050 Calculus of bile duct without cholangitis or cholecystitis without obstruction: Secondary | ICD-10-CM

## 2018-09-12 DIAGNOSIS — I10 Essential (primary) hypertension: Secondary | ICD-10-CM

## 2018-09-12 HISTORY — PX: CHOLECYSTECTOMY: SHX55

## 2018-09-12 LAB — SURGICAL PCR SCREEN
MRSA, PCR: NEGATIVE
Staphylococcus aureus: POSITIVE — AB

## 2018-09-12 SURGERY — LAPAROSCOPIC CHOLECYSTECTOMY WITH INTRAOPERATIVE CHOLANGIOGRAM
Anesthesia: General

## 2018-09-12 MED ORDER — MUPIROCIN 2 % EX OINT
1.0000 "application " | TOPICAL_OINTMENT | Freq: Two times a day (BID) | CUTANEOUS | Status: DC
  Administered 2018-09-12 – 2018-09-13 (×4): 1 via NASAL
  Filled 2018-09-12: qty 22

## 2018-09-12 MED ORDER — OXYCODONE HCL 5 MG PO TABS
5.0000 mg | ORAL_TABLET | Freq: Once | ORAL | Status: AC | PRN
  Administered 2018-09-12: 5 mg via ORAL

## 2018-09-12 MED ORDER — LABETALOL HCL 5 MG/ML IV SOLN
INTRAVENOUS | Status: DC | PRN
  Administered 2018-09-12: 5 mg via INTRAVENOUS

## 2018-09-12 MED ORDER — LABETALOL HCL 5 MG/ML IV SOLN
INTRAVENOUS | Status: AC
  Filled 2018-09-12: qty 4

## 2018-09-12 MED ORDER — PROPOFOL 10 MG/ML IV BOLUS
INTRAVENOUS | Status: AC
  Filled 2018-09-12: qty 20

## 2018-09-12 MED ORDER — ACETAMINOPHEN 10 MG/ML IV SOLN: 1000.0000 mg | Freq: Once | INTRAVENOUS | Status: DC | PRN

## 2018-09-12 MED ORDER — 0.9 % SODIUM CHLORIDE (POUR BTL) OPTIME
TOPICAL | Status: DC | PRN
  Administered 2018-09-12: 1000 mL

## 2018-09-12 MED ORDER — FENTANYL CITRATE (PF) 250 MCG/5ML IJ SOLN
INTRAMUSCULAR | Status: DC | PRN
  Administered 2018-09-12: 50 ug via INTRAVENOUS
  Administered 2018-09-12 (×3): 100 ug via INTRAVENOUS

## 2018-09-12 MED ORDER — HYDRALAZINE HCL 20 MG/ML IJ SOLN: 10.0000 mg | Freq: Once | INTRAMUSCULAR | Status: DC

## 2018-09-12 MED ORDER — OXYCODONE HCL 5 MG PO TABS
ORAL_TABLET | ORAL | Status: AC
  Filled 2018-09-12: qty 1

## 2018-09-12 MED ORDER — MIDAZOLAM HCL 5 MG/5ML IJ SOLN
INTRAMUSCULAR | Status: DC | PRN
  Administered 2018-09-12: 2 mg via INTRAVENOUS

## 2018-09-12 MED ORDER — HYDRALAZINE HCL 20 MG/ML IJ SOLN
INTRAMUSCULAR | Status: AC
  Filled 2018-09-12: qty 1

## 2018-09-12 MED ORDER — ACETAMINOPHEN 160 MG/5ML PO SOLN: 1000.0000 mg | Freq: Once | ORAL | Status: AC | PRN

## 2018-09-12 MED ORDER — FENTANYL CITRATE (PF) 100 MCG/2ML IJ SOLN: 25.0000 ug | INTRAMUSCULAR | Status: DC | PRN

## 2018-09-12 MED ORDER — LIDOCAINE HCL (CARDIAC) PF 100 MG/5ML IV SOSY
PREFILLED_SYRINGE | INTRAVENOUS | Status: DC | PRN
  Administered 2018-09-12: 50 mg via INTRATRACHEAL

## 2018-09-12 MED ORDER — SUGAMMADEX SODIUM 500 MG/5ML IV SOLN
INTRAVENOUS | Status: AC
  Filled 2018-09-12: qty 5

## 2018-09-12 MED ORDER — PROPOFOL 10 MG/ML IV BOLUS
INTRAVENOUS | Status: DC | PRN
  Administered 2018-09-12: 160 mg via INTRAVENOUS

## 2018-09-12 MED ORDER — ROCURONIUM BROMIDE 100 MG/10ML IV SOLN
INTRAVENOUS | Status: DC | PRN
  Administered 2018-09-12: 30 mg via INTRAVENOUS

## 2018-09-12 MED ORDER — SUCCINYLCHOLINE CHLORIDE 200 MG/10ML IV SOSY
PREFILLED_SYRINGE | INTRAVENOUS | Status: AC
  Filled 2018-09-12: qty 10

## 2018-09-12 MED ORDER — MIDAZOLAM HCL 2 MG/2ML IJ SOLN
INTRAMUSCULAR | Status: AC
  Filled 2018-09-12: qty 2

## 2018-09-12 MED ORDER — PHENYLEPHRINE 40 MCG/ML (10ML) SYRINGE FOR IV PUSH (FOR BLOOD PRESSURE SUPPORT)
PREFILLED_SYRINGE | INTRAVENOUS | Status: AC
  Filled 2018-09-12: qty 10

## 2018-09-12 MED ORDER — ACETAMINOPHEN 500 MG PO TABS
1000.0000 mg | ORAL_TABLET | Freq: Once | ORAL | Status: AC | PRN
  Administered 2018-09-12: 1000 mg via ORAL

## 2018-09-12 MED ORDER — ONDANSETRON HCL 4 MG/2ML IJ SOLN
INTRAMUSCULAR | Status: DC | PRN
  Administered 2018-09-12: 4 mg via INTRAVENOUS

## 2018-09-12 MED ORDER — LACTATED RINGERS IV SOLN
INTRAVENOUS | Status: DC | PRN
  Administered 2018-09-12 (×2): via INTRAVENOUS

## 2018-09-12 MED ORDER — SUFENTANIL CITRATE 50 MCG/ML IV SOLN
INTRAVENOUS | Status: AC
  Filled 2018-09-12: qty 1

## 2018-09-12 MED ORDER — ACETAMINOPHEN 500 MG PO TABS
ORAL_TABLET | ORAL | Status: AC
  Filled 2018-09-12: qty 2

## 2018-09-12 MED ORDER — FENTANYL CITRATE (PF) 250 MCG/5ML IJ SOLN
INTRAMUSCULAR | Status: AC
  Filled 2018-09-12: qty 5

## 2018-09-12 MED ORDER — BUPIVACAINE-EPINEPHRINE 0.25% -1:200000 IJ SOLN
INTRAMUSCULAR | Status: DC | PRN
  Administered 2018-09-12: 20 mL

## 2018-09-12 MED ORDER — MUPIROCIN 2 % EX OINT: 1.0000 "application " | TOPICAL_OINTMENT | Freq: Two times a day (BID) | CUTANEOUS | Status: DC

## 2018-09-12 MED ORDER — SUCCINYLCHOLINE CHLORIDE 20 MG/ML IJ SOLN
INTRAMUSCULAR | Status: DC | PRN
  Administered 2018-09-12: 70 mg via INTRAVENOUS

## 2018-09-12 MED ORDER — CEFAZOLIN SODIUM-DEXTROSE 2-3 GM-%(50ML) IV SOLR
INTRAVENOUS | Status: DC | PRN
  Administered 2018-09-12: 2 g via INTRAVENOUS

## 2018-09-12 MED ORDER — IOPAMIDOL (ISOVUE-300) INJECTION 61%
INTRAVENOUS | Status: AC
  Filled 2018-09-12: qty 50

## 2018-09-12 MED ORDER — CEFAZOLIN SODIUM 1 G IJ SOLR
INTRAMUSCULAR | Status: AC
  Filled 2018-09-12: qty 60

## 2018-09-12 MED ORDER — SUGAMMADEX SODIUM 200 MG/2ML IV SOLN
INTRAVENOUS | Status: DC | PRN
  Administered 2018-09-12: 200 mg via INTRAVENOUS

## 2018-09-12 MED ORDER — SODIUM CHLORIDE 0.9 % IR SOLN
Status: DC | PRN
  Administered 2018-09-12: 1000 mL

## 2018-09-12 MED ORDER — SODIUM CHLORIDE 0.9 % IV SOLN
2.0000 g | Freq: Once | INTRAVENOUS | Status: AC
  Administered 2018-09-13: 2 g via INTRAVENOUS
  Filled 2018-09-12: qty 20

## 2018-09-12 MED ORDER — ROCURONIUM BROMIDE 50 MG/5ML IV SOSY
PREFILLED_SYRINGE | INTRAVENOUS | Status: AC
  Filled 2018-09-12: qty 5

## 2018-09-12 MED ORDER — BUPIVACAINE-EPINEPHRINE (PF) 0.25% -1:200000 IJ SOLN
INTRAMUSCULAR | Status: AC
  Filled 2018-09-12: qty 30

## 2018-09-12 MED ORDER — OXYCODONE HCL 5 MG/5ML PO SOLN: 5.0000 mg | Freq: Once | ORAL | Status: AC | PRN

## 2018-09-12 MED ORDER — LIDOCAINE 2% (20 MG/ML) 5 ML SYRINGE
INTRAMUSCULAR | Status: AC
  Filled 2018-09-12: qty 5

## 2018-09-12 MED ORDER — CHLORHEXIDINE GLUCONATE CLOTH 2 % EX PADS
6.0000 | MEDICATED_PAD | Freq: Every day | CUTANEOUS | Status: DC
  Administered 2018-09-12: 6 via TOPICAL

## 2018-09-12 MED ORDER — KETOROLAC TROMETHAMINE 30 MG/ML IJ SOLN
INTRAMUSCULAR | Status: DC | PRN
  Administered 2018-09-12: 30 mg via INTRAVENOUS

## 2018-09-12 MED ORDER — ONDANSETRON HCL 4 MG/2ML IJ SOLN
INTRAMUSCULAR | Status: AC
  Filled 2018-09-12: qty 2

## 2018-09-12 SURGICAL SUPPLY — 45 items
ADH SKN CLS APL DERMABOND .7 (GAUZE/BANDAGES/DRESSINGS) ×1
ADH SKN CLS LQ APL DERMABOND (GAUZE/BANDAGES/DRESSINGS) ×1
APPLIER CLIP 5 13 M/L LIGAMAX5 (MISCELLANEOUS) ×2
APR CLP MED LRG 5 ANG JAW (MISCELLANEOUS) ×1
BAG SPEC RTRVL LRG 6X4 10 (ENDOMECHANICALS) ×1
BLADE 15 SAFETY STRL DISP (BLADE) ×2 IMPLANT
BLADE CLIPPER SURG (BLADE) IMPLANT
CANISTER SUCT 3000ML PPV (MISCELLANEOUS) ×2 IMPLANT
CHLORAPREP W/TINT 26ML (MISCELLANEOUS) ×2 IMPLANT
CLIP APPLIE 5 13 M/L LIGAMAX5 (MISCELLANEOUS) ×1 IMPLANT
COVER MAYO STAND STRL (DRAPES) IMPLANT
COVER SURGICAL LIGHT HANDLE (MISCELLANEOUS) ×2 IMPLANT
COVER WAND RF STERILE (DRAPES) ×2 IMPLANT
DERMABOND ADHESIVE PROPEN (GAUZE/BANDAGES/DRESSINGS) ×1
DERMABOND ADVANCED (GAUZE/BANDAGES/DRESSINGS) ×1
DERMABOND ADVANCED .7 DNX12 (GAUZE/BANDAGES/DRESSINGS) ×1 IMPLANT
DERMABOND ADVANCED .7 DNX6 (GAUZE/BANDAGES/DRESSINGS) ×1 IMPLANT
DRAPE C-ARM 42X72 X-RAY (DRAPES) IMPLANT
ELECT REM PT RETURN 9FT ADLT (ELECTROSURGICAL) ×2
ELECTRODE REM PT RTRN 9FT ADLT (ELECTROSURGICAL) ×1 IMPLANT
GLOVE SURG SIGNA 7.5 PF LTX (GLOVE) ×2 IMPLANT
GOWN STRL REUS W/ TWL LRG LVL3 (GOWN DISPOSABLE) ×2 IMPLANT
GOWN STRL REUS W/ TWL XL LVL3 (GOWN DISPOSABLE) ×1 IMPLANT
GOWN STRL REUS W/TWL LRG LVL3 (GOWN DISPOSABLE) ×4
GOWN STRL REUS W/TWL XL LVL3 (GOWN DISPOSABLE) ×2
KIT BASIN OR (CUSTOM PROCEDURE TRAY) ×2 IMPLANT
KIT TURNOVER KIT B (KITS) ×2 IMPLANT
NS IRRIG 1000ML POUR BTL (IV SOLUTION) ×2 IMPLANT
PAD ARMBOARD 7.5X6 YLW CONV (MISCELLANEOUS) ×2 IMPLANT
POUCH SPECIMEN RETRIEVAL 10MM (ENDOMECHANICALS) ×2 IMPLANT
SCISSORS LAP 5X35 DISP (ENDOMECHANICALS) ×2 IMPLANT
SET CHOLANGIOGRAPH 5 50 .035 (SET/KITS/TRAYS/PACK) IMPLANT
SET IRRIG TUBING LAPAROSCOPIC (IRRIGATION / IRRIGATOR) ×2 IMPLANT
SLEEVE ENDOPATH XCEL 5M (ENDOMECHANICALS) ×4 IMPLANT
SLEEVE SURGEON STRL (DRAPES) ×2 IMPLANT
SPECIMEN JAR SMALL (MISCELLANEOUS) ×2 IMPLANT
SUT MNCRL AB 4-0 PS2 18 (SUTURE) ×2 IMPLANT
SUT VICRYL 0 UR6 27IN ABS (SUTURE) ×4 IMPLANT
TOWEL OR 17X24 6PK STRL BLUE (TOWEL DISPOSABLE) ×2 IMPLANT
TOWEL OR 17X26 10 PK STRL BLUE (TOWEL DISPOSABLE) ×2 IMPLANT
TRAY LAPAROSCOPIC MC (CUSTOM PROCEDURE TRAY) ×2 IMPLANT
TROCAR XCEL BLUNT TIP 100MML (ENDOMECHANICALS) ×2 IMPLANT
TROCAR XCEL NON-BLD 5MMX100MML (ENDOMECHANICALS) ×2 IMPLANT
TUBING INSUFFLATION (TUBING) ×2 IMPLANT
WATER STERILE IRR 1000ML POUR (IV SOLUTION) ×2 IMPLANT

## 2018-09-12 NOTE — Progress Notes (Signed)
  PROGRESS NOTE  Donald Rice NWG:[id number] DOB: [birth year] DOA: 09/11/2018 PCP: Joycelyn Rua, MD  Brief Narrative: 52 year old man no significant past medical history other than essential hypertension presented with epigastric pain.  Admitted for presumed acute cholecystitis.  Assessment/Plan Biliary colic, cholelithiasis. --Management per surgery.  Plans for cholecystectomy today.  Essential hypertension --Stable.  Can resume losartan post surgery.   Patient appears medically stable, will discuss care with Dr. Magnus Ivan.  DVT prophylaxis: per surgery Code Status: full Family Communication: none Disposition Plan: per surgery    Brendia Sacks, MD  [facility] Direct contact: [phone] --Via amion app OR  --[email or web address]; password [email or web address]  7PM-7AM contact night coverage as above 09/12/2018, 11:54 AM  LOS: 1 day   Consultants:  General surgery  Procedures:    Antimicrobials:    Interval history/Subjective: Feels better.  No pain currently.  Objective: Vitals:  Vitals:   09/11/18 2043 09/12/18 0829  BP: (!) 146/96 (!) 146/98  Pulse: 76 84  Resp: 18 18  Temp: 98.9 F (37.2 C) 98.3 F (36.8 C)  SpO2: 98% 98%    Exam:  Constitutional:  . Appears calm and comfortable Respiratory:  . CTA bilaterally, no w/r/r.  . Respiratory effort normal.  Cardiovascular:  . RRR, no m/r/g . No LE extremity edema   Abdomen:  . Soft, ntnd Psychiatric:  . Mental status o Mood, affect appropriate  I have personally reviewed the following:   Data: . CMP unremarkable, lipase within normal limits, troponin negative, CBC unremarkable.  Chest x-ray and EKG independently reviewed, no acute changes; normal sinus rhythm. . CT abdomen pelvis and abdominal ultrasound reviewed  Scheduled Meds: . [MAR Hold] aspirin EC  81 mg Oral Daily  . [MAR Hold] buPROPion  150 mg Oral Daily  . [MAR Hold] Chlorhexidine Gluconate Cloth  6 each Topical Daily  . [MAR Hold]  enoxaparin (LOVENOX) injection  50 mg Subcutaneous Q24H  . [MAR Hold] iohexol  100 mL Intravenous Once  . [MAR Hold] losartan  50 mg Oral Daily  . [MAR Hold] mupirocin ointment  1 application Nasal BID  . [MAR Hold] mupirocin ointment  1 application Nasal BID   Continuous Infusions: . sodium chloride 75 mL/hr at 09/12/18 0602  . [MAR Hold] famotidine (PEPCID) IV 20 mg (09/12/18 0844)    Principal Problem:   Biliary colic Active Problems:   Gallstones   Epigastric pain   Essential hypertension   LOS: 1 day

## 2018-09-12 NOTE — Anesthesia Procedure Notes (Signed)
Procedure Name: Intubation Date/Time: 09/12/2018 1:01 PM Performed by: Claudina Lick, CRNA Pre-anesthesia Checklist: Patient identified, Emergency Drugs available, Suction available, Patient being monitored and Timeout performed Patient Re-evaluated:Patient Re-evaluated prior to induction Oxygen Delivery Method: Circle system utilized Preoxygenation: Pre-oxygenation with 100% oxygen Induction Type: IV induction, Rapid sequence and Cricoid Pressure applied Laryngoscope Size: Miller and 2 Grade View: Grade I Tube type: Oral Tube size: 7.5 mm Number of attempts: 1 Airway Equipment and Method: Stylet Placement Confirmation: ETT inserted through vocal cords under direct vision,  positive ETCO2 and breath sounds checked- equal and bilateral Secured at: 22 cm Tube secured with: Tape Dental Injury: Teeth and Oropharynx as per pre-operative assessment

## 2018-09-12 NOTE — Anesthesia Preprocedure Evaluation (Signed)
Anesthesia Evaluation  Patient identified by MRN, date of birth, ID band Patient awake    Reviewed: Allergy & Precautions, NPO status , Patient's Chart, lab work & pertinent test results  History of Anesthesia Complications Negative for: history of anesthetic complications  Airway Mallampati: III  TM Distance: >3 FB Neck ROM: Full    Dental  (+) Teeth Intact,    Pulmonary Current Smoker,    breath sounds clear to auscultation       Cardiovascular hypertension, Pt. on medications (-) angina(-) Past MI and (-) CHF  Rhythm:Regular     Neuro/Psych negative neurological ROS     GI/Hepatic Neg liver ROS, cholecystitis   Endo/Other  neg diabetesMorbid obesity  Renal/GU negative Renal ROS     Musculoskeletal negative musculoskeletal ROS (+)   Abdominal   Peds  Hematology negative hematology ROS (+)   Anesthesia Other Findings   Reproductive/Obstetrics                             Anesthesia Physical Anesthesia Plan  ASA: II  Anesthesia Plan: General   Post-op Pain Management:    Induction: Intravenous  PONV Risk Score and Plan: 1 and Ondansetron and Treatment may vary due to age or medical condition  Airway Management Planned: Oral ETT  Additional Equipment: None  Intra-op Plan:   Post-operative Plan: Extubation in OR  Informed Consent: I have reviewed the patients History and Physical, chart, labs and discussed the procedure including the risks, benefits and alternatives for the proposed anesthesia with the patient or authorized representative who has indicated his/her understanding and acceptance.   Dental advisory given  Plan Discussed with: Surgeon and CRNA  Anesthesia Plan Comments:         Anesthesia Quick Evaluation

## 2018-09-12 NOTE — Progress Notes (Signed)
Patient ID: Donald Rice, male   DOB: [birth year], 52 y.o.   MRN: [id number]  Plan lap chole today. I discussed the procedure in detail.  We discussed the risks and benefits of a laparoscopic cholecystectomy  including, but not limited to bleeding, infection, injury to surrounding structures such as the intestine or liver, bile leak, retained gallstones, need to convert to an open procedure, prolonged diarrhea, blood clots such as  DVT, common bile duct injury, anesthesia risks, and possible need for additional procedures.  The likelihood of improvement in symptoms and return to the patient's normal status is good. We discussed the typical post-operative recovery course.

## 2018-09-12 NOTE — Transfer of Care (Signed)
Immediate Anesthesia Transfer of Care Note  Patient: Donald Rice  Procedure(s) Performed: LAPAROSCOPIC CHOLECYSTECTOMY (N/A )  Patient Location: PACU  Anesthesia Type:General  Level of Consciousness: drowsy  Airway & Oxygen Therapy: Patient Spontanous Breathing and Patient connected to nasal cannula oxygen  Post-op Assessment: Report given to RN and Post -op Vital signs reviewed and stable  Post vital signs: Reviewed and stable  Last Vitals:  Vitals Value Taken Time  BP 155/99 09/12/2018  2:02 PM  Temp    Pulse 89 09/12/2018  2:03 PM  Resp 14 09/12/2018  2:03 PM  SpO2 96 % 09/12/2018  2:03 PM  Vitals shown include unvalidated device data.  Last Pain:  Vitals:   09/12/18 0829  TempSrc: Oral  PainSc:       Patients Stated Pain Goal: 0 (09/11/18 2230)  Complications: No apparent anesthesia complications

## 2018-09-12 NOTE — Op Note (Signed)
Laparoscopic Cholecystectomy Procedure Note  Indications: This patient presents with symptomatic gallbladder disease and will undergo laparoscopic cholecystectomy.  Pre-operative Diagnosis: symptomatic cholelithiasis  Post-operative Diagnosis: acute cholecystitis with cholelithiasis  Surgeon: Abigail Miyamoto A   Assistants: 0  Anesthesia: General endotracheal anesthesia  ASA Class: 2  Procedure Details  The patient was seen again in the Holding Room. The risks, benefits, complications, treatment options, and expected outcomes were discussed with the patient. The possibilities of reaction to medication, pulmonary aspiration, perforation of viscus, bleeding, recurrent infection, finding a normal gallbladder, the need for additional procedures, failure to diagnose a condition, the possible need to convert to an open procedure, and creating a complication requiring transfusion or operation were discussed with the patient. The likelihood of improving the patient's symptoms with return to their baseline status is good.  The patient and/or family concurred with the proposed plan, giving informed consent. The site of surgery properly noted. The patient was taken to Operating Room, identified as [facility] and the procedure verified as Laparoscopic Cholecystectomy with Intraoperative Cholangiogram. A Time Out was held and the above information confirmed.  Prior to the induction of general anesthesia, antibiotic prophylaxis was administered. General endotracheal anesthesia was then administered and tolerated well. After the induction, the abdomen was prepped with Chloraprep and draped in sterile fashion. The patient was positioned in the supine position.  Local anesthetic agent was injected into the skin near the umbilicus and an incision made. We dissected down to the abdominal fascia with blunt dissection.  There was a small umbilical hernia.  I used this for my fascial opening and incised the the  fascia  vertically to make the incision bigger and entered the peritoneal cavity bluntly.  A pursestring suture of 0-Vicryl was placed around the fascial opening.  The Hasson cannula was inserted and secured with the stay suture.  Pneumoperitoneum was then created with CO2 and tolerated well without any adverse changes in the patient's vital signs. A 5-mm port was placed in the subxiphoid position.  Two 5-mm ports were placed in the right upper quadrant. All skin incisions were infiltrated with a local anesthetic agent before making the incision and placing the trocars.   We positioned the patient in reverse Trendelenburg, tilted slightly to the patient's left.  The gallbladder was acutely inflamed and distended.  I had to aspirate bile from the gallbladder in order to grasp it.  The  fundus grasped and retracted cephalad. Adhesions were lysed bluntly and with the electrocautery where indicated, taking care not to injure any adjacent organs or viscus. The infundibulum was grasped and retracted laterally, exposing the peritoneum overlying the triangle of Calot. This was then divided and exposed in a blunt fashion. The cystic duct was clearly identified and bluntly dissected circumferentially. A critical view of the cystic duct and cystic artery was obtained.  The cystic duct was then ligated with clips and divided. The cystic artery was, dissected free, ligated with clips and divided as well.   The gallbladder was dissected from the liver bed in retrograde fashion with the electrocautery. The gallbladder was removed and placed in an Endocatch sac. The liver bed was irrigated and inspected. Hemostasis was achieved with the electrocautery. Copious irrigation was utilized and was repeatedly aspirated until clear.  There were multiple large stones in the gallbladder as well as small stones.  One stone was so large that I had to make the fascial incision much larger in order to remove the gallbladder and stone.  I  then  had to remove the pursestring suture and closed the fascia with 2 separate figure-of-eight 0 Vicryl sutures.  We again inspected the right upper quadrant for hemostasis.  Pneumoperitoneum was released as we removed the trocars.  4-0 Monocryl was used to close the skin.  Skin glue was then applied. The patient was then extubated and brought to the recovery room in stable condition. Instrument, sponge, and needle counts were correct at closure and at the conclusion of the case.   Findings: Acute Cholecystitis with Cholelithiasis  Estimated Blood Loss: Minimal         Drains: 0         Specimens: Gallbladder           Complications: None; patient tolerated the procedure well.         Disposition: PACU - hemodynamically stable.         Condition: stable

## 2018-09-13 ENCOUNTER — Encounter (HOSPITAL_COMMUNITY): Payer: Self-pay | Admitting: Surgery

## 2018-09-13 MED ORDER — OXYCODONE HCL 5 MG PO TABS: 5.0000 mg | ORAL_TABLET | Freq: Four times a day (QID) | ORAL | 0 refills | Status: AC | PRN

## 2018-09-13 NOTE — Discharge Instructions (Signed)
[facility] LAPAROSCOPIC SURGERY: POST OP INSTRUCTIONS Take 400 mg ibuprofen every 8 hours for next 72 hours and use ice several times daily especially to bellybutton.  Always review your discharge instruction sheet given to you by the facility where your surgery was performed. IF YOU HAVE DISABILITY OR FAMILY LEAVE FORMS, YOU MUST BRING THEM TO THE OFFICE FOR PROCESSING.   DO NOT GIVE THEM TO YOUR DOCTOR.  1. A prescription for pain medication may be given to you upon discharge.  Take your pain medication as prescribed, if needed.  If narcotic pain medicine is not needed, then you may take acetaminophen (Tylenol), naprosyn (Alleve), or ibuprofen (Advil) as needed. 2. Take your usually prescribed medications unless otherwise directed. 3. If you need a refill on your pain medication, please contact your pharmacy.  They will contact our office to request authorization. Prescriptions will not be filled after 5pm or on week-ends. 4. You should follow a light diet the first few days after arrival home, such as soup and crackers, etc.  Be sure to include lots of fluids daily. 5. Most patients will experience some swelling and bruising in the area of the incisions.  Ice packs will help.  Swelling and bruising can take several days to resolve.  6. It is common to experience some constipation if taking pain medication after surgery.  Increasing fluid intake and taking a stool softener (such as Colace) will usually help or prevent this problem from occurring.  A mild laxative (Milk of Magnesia or Miralax) should be taken according to package instructions if there are no bowel movements after 48 hours. 7. Unless discharge instructions indicate otherwise, you may remove your bandages 48 hours after surgery, and you may shower at that time.  You may have steri-strips (small skin tapes) in place directly over the incision.  These strips should be left on the  skin for 7-10 days.  If your surgeon used skin glue on the incision, you may shower in 24 hours.  The glue will flake off over the next 2-3 weeks.  Any sutures or staples will be removed at the office during your follow-up visit. 8. ACTIVITIES:  You may resume regular (light) daily activities beginning the next day--such as daily self-care, walking, climbing stairs--gradually increasing activities as tolerated.  You may have sexual intercourse when it is comfortable.  Refrain from any heavy lifting or straining until approved by your doctor. a. You may drive when you are no longer taking prescription pain medication, you can comfortably wear a seatbelt, and you can safely maneuver your car and apply brakes. b. RETURN TO WORK:  __________________________________________________________ 9. You should see your doctor in the office for a follow-up appointment approximately 2-3 weeks after your surgery.  Make sure that you call for this appointment within a day or two after you arrive home to insure a convenient appointment time. 10. OTHER INSTRUCTIONS: __________________________________________________________________________________________________________________________ __________________________________________________________________________________________________________________________ WHEN TO CALL YOUR DOCTOR: 1. Fever over 101.0 2. Inability to urinate 3. Continued bleeding from incision. 4. Increased pain, redness, or drainage from the incision. 5. Increasing abdominal pain  The clinic staff is available to answer your questions during regular business hours.  Please dont hesitate to call and ask to speak to one of the nurses for clinical concerns.  If you have a medical emergency, go to the nearest emergency room or call 911.  A surgeon from [facility] is always on call at the hospital. [address], [address],  [place], [place]  [address] ? [address] [address], [place], [place]    [address] [phone] ? [phone] ? FAX [phone] Web site: [email or web address]

## 2018-09-13 NOTE — Discharge Summary (Signed)
Physician Discharge Summary  Patient ID: Donald Rice MRN: [id number] DOB/AGE: 04-08-1966 52 y.o.  Admit date: 09/11/2018 Discharge date: 09/13/2018  Admission Diagnoses: Gallstones htn  Discharge Diagnoses:  Principal Problem:   Biliary colic Active Problems:   Gallstones   Epigastric pain   Essential hypertension   Discharged Condition: good  Hospital Course: 33 yom admitted with cholecystitis. Underwent uneventful lap chole.  He is doing well following am, pain controlled, voiding tolerating regular diet, will be discharged home  Consults: None  Significant Diagnostic Studies: [place]  Treatments: surgery: lap chole  Discharge Exam: Blood pressure 126/82, pulse 87, temperature 98 F (36.7 C), temperature source Oral, resp. rate 18, height 5\' 10"  (1.778 m), weight 108.7 kg, SpO2 97 %. GI: soft approp tender incisions clean  Disposition: home No lifting over 15 pounds for four weeks      Signed: Emelia Loron 09/13/2018, 9:21 AM

## 2018-09-13 NOTE — Progress Notes (Signed)
After visit summary reviewed and patient capable of re verbalizing medications and follow up appointments. Pt remains stable. No signs and symptoms of distress. Educated to return to ER in the event of SOB, dizziness, chest pain, or fainting. Reshad Saab, RN  

## 2018-09-13 NOTE — Anesthesia Postprocedure Evaluation (Signed)
Anesthesia Post Note  Patient: Donald Rice  Procedure(s) Performed: LAPAROSCOPIC CHOLECYSTECTOMY (N/A )     Patient location during evaluation: PACU Anesthesia Type: General Level of consciousness: awake and alert Pain management: pain level controlled Vital Signs Assessment: post-procedure vital signs reviewed and stable Respiratory status: spontaneous breathing, nonlabored ventilation, respiratory function stable and patient connected to nasal cannula oxygen Cardiovascular status: blood pressure returned to baseline and stable Postop Assessment: no apparent nausea or vomiting Anesthetic complications: no    Last Vitals:  Vitals:   09/13/18 0358 09/13/18 0802  BP: (!) 117/95 126/82  Pulse: 88 87  Resp: 18 18  Temp: 36.9 C 36.7 C  SpO2: 95% 97%    Last Pain:  Vitals:   09/13/18 0802  TempSrc: Oral  PainSc:                  Sade Mehlhoff

## 2018-09-28 ENCOUNTER — Encounter (HOSPITAL_COMMUNITY): Payer: Self-pay | Admitting: Emergency Medicine

## 2018-09-28 ENCOUNTER — Other Ambulatory Visit: Payer: Self-pay

## 2018-09-28 ENCOUNTER — Emergency Department (HOSPITAL_COMMUNITY)
Admission: EM | Admit: 2018-09-28 | Discharge: 2018-09-28 | Disposition: A | Discharge status: Home / Self Care | Payer: BLUE CROSS/BLUE SHIELD | Attending: Emergency Medicine | Admitting: Emergency Medicine

## 2018-09-28 DIAGNOSIS — Z7982 Long term (current) use of aspirin: Secondary | ICD-10-CM | POA: Insufficient documentation

## 2018-09-28 DIAGNOSIS — F17210 Nicotine dependence, cigarettes, uncomplicated: Secondary | ICD-10-CM | POA: Insufficient documentation

## 2018-09-28 DIAGNOSIS — K625 Hemorrhage of anus and rectum: Secondary | ICD-10-CM | POA: Diagnosis not present

## 2018-09-28 DIAGNOSIS — Z79899 Other long term (current) drug therapy: Secondary | ICD-10-CM | POA: Insufficient documentation

## 2018-09-28 DIAGNOSIS — I10 Essential (primary) hypertension: Secondary | ICD-10-CM | POA: Insufficient documentation

## 2018-09-28 DIAGNOSIS — K922 Gastrointestinal hemorrhage, unspecified: Secondary | ICD-10-CM | POA: Diagnosis not present

## 2018-09-28 HISTORY — DX: Essential (primary) hypertension: I10

## 2018-09-28 HISTORY — DX: Personal history of other diseases of the digestive system: Z87.19

## 2018-09-28 HISTORY — DX: Other specified postprocedural states: Z98.890

## 2018-09-28 LAB — CBC
HCT: 44.6 % (ref 39.0–52.0)
Hemoglobin: 14.8 g/dL (ref 13.0–17.0)
MCH: 28.4 pg (ref 26.0–34.0)
MCHC: 33.2 g/dL (ref 30.0–36.0)
MCV: 85.6 fL (ref 80.0–100.0)
Platelets: 333 10*3/uL (ref 150–400)
RBC: 5.21 MIL/uL (ref 4.22–5.81)
RDW: 12.6 % (ref 11.5–15.5)
WBC: 10.7 10*3/uL — ABNORMAL HIGH (ref 4.0–10.5)
nRBC: 0 % (ref 0.0–0.2)

## 2018-09-28 LAB — COMPREHENSIVE METABOLIC PANEL
ALK PHOS: 68 U/L (ref 38–126)
ALT: 27 U/L (ref 0–44)
AST: 26 U/L (ref 15–41)
Albumin: 3.9 g/dL (ref 3.5–5.0)
Anion gap: 8 (ref 5–15)
BUN: 20 mg/dL (ref 6–20)
CALCIUM: 9 mg/dL (ref 8.9–10.3)
CHLORIDE: 106 mmol/L (ref 98–111)
CO2: 23 mmol/L (ref 22–32)
CREATININE: 0.96 mg/dL (ref 0.61–1.24)
GFR calc non Af Amer: >60 mL/min (ref >60)
Glucose, Bld: 155 mg/dL — ABNORMAL HIGH (ref 70–99)
Potassium: 3.3 mmol/L — ABNORMAL LOW (ref 3.5–5.1)
SODIUM: 137 mmol/L (ref 135–145)
Total Bilirubin: 0.6 mg/dL (ref 0.3–1.2)
Total Protein: 6.7 g/dL (ref 6.5–8.1)

## 2018-09-28 LAB — TYPE AND SCREEN
ABO/RH(D): A POS
ANTIBODY SCREEN: NEGATIVE

## 2018-09-28 LAB — ABO/RH: ABO/RH(D): A POS

## 2018-09-28 NOTE — ED Notes (Signed)
Pt given [place] sandwich and diet coke per Provider
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
## 2018-09-28 NOTE — ED Triage Notes (Addendum)
2 weeks ago he had gall bladder removal and hernia repair. Today he followed up with MD who cleared him. Later on he began having bright ready diarrhea X 2. No pain anywhere.

## 2018-09-28 NOTE — ED Notes (Signed)
Pt stands on own ability for Ortho VS. No complaints. Steady and strong on feet.

## 2018-11-24 DIAGNOSIS — G894 Chronic pain syndrome: Secondary | ICD-10-CM | POA: Diagnosis not present

## 2019-01-18 DIAGNOSIS — R0781 Pleurodynia: Secondary | ICD-10-CM | POA: Diagnosis not present

## 2019-02-22 DIAGNOSIS — G894 Chronic pain syndrome: Secondary | ICD-10-CM | POA: Diagnosis not present

## 2019-05-25 DIAGNOSIS — G894 Chronic pain syndrome: Secondary | ICD-10-CM | POA: Diagnosis not present

## 2019-05-25 DIAGNOSIS — Z0000 Encounter for general adult medical examination without abnormal findings: Secondary | ICD-10-CM | POA: Diagnosis not present

## 2019-05-25 DIAGNOSIS — I10 Essential (primary) hypertension: Secondary | ICD-10-CM | POA: Diagnosis not present

## 2019-05-25 DIAGNOSIS — E782 Mixed hyperlipidemia: Secondary | ICD-10-CM | POA: Diagnosis not present

## 2019-05-25 DIAGNOSIS — Z125 Encounter for screening for malignant neoplasm of prostate: Secondary | ICD-10-CM | POA: Diagnosis not present

## 2019-05-25 DIAGNOSIS — R7303 Prediabetes: Secondary | ICD-10-CM | POA: Diagnosis not present

## 2019-07-20 DIAGNOSIS — M5412 Radiculopathy, cervical region: Secondary | ICD-10-CM | POA: Diagnosis not present

## 2019-07-20 DIAGNOSIS — M9901 Segmental and somatic dysfunction of cervical region: Secondary | ICD-10-CM | POA: Diagnosis not present

## 2019-07-21 DIAGNOSIS — M9901 Segmental and somatic dysfunction of cervical region: Secondary | ICD-10-CM | POA: Diagnosis not present

## 2019-07-21 DIAGNOSIS — M5412 Radiculopathy, cervical region: Secondary | ICD-10-CM | POA: Diagnosis not present

## 2019-07-25 DIAGNOSIS — M5412 Radiculopathy, cervical region: Secondary | ICD-10-CM | POA: Diagnosis not present

## 2019-07-25 DIAGNOSIS — M9901 Segmental and somatic dysfunction of cervical region: Secondary | ICD-10-CM | POA: Diagnosis not present

## 2019-07-26 DIAGNOSIS — M9901 Segmental and somatic dysfunction of cervical region: Secondary | ICD-10-CM | POA: Diagnosis not present

## 2019-07-26 DIAGNOSIS — M5412 Radiculopathy, cervical region: Secondary | ICD-10-CM | POA: Diagnosis not present

## 2019-08-03 DIAGNOSIS — M5116 Intervertebral disc disorders with radiculopathy, lumbar region: Secondary | ICD-10-CM | POA: Diagnosis not present

## 2019-08-03 DIAGNOSIS — M9901 Segmental and somatic dysfunction of cervical region: Secondary | ICD-10-CM | POA: Diagnosis not present

## 2019-08-03 DIAGNOSIS — M5412 Radiculopathy, cervical region: Secondary | ICD-10-CM | POA: Diagnosis not present

## 2019-08-04 DIAGNOSIS — M5412 Radiculopathy, cervical region: Secondary | ICD-10-CM | POA: Diagnosis not present

## 2019-08-04 DIAGNOSIS — M9901 Segmental and somatic dysfunction of cervical region: Secondary | ICD-10-CM | POA: Diagnosis not present

## 2019-08-08 DIAGNOSIS — M5412 Radiculopathy, cervical region: Secondary | ICD-10-CM | POA: Diagnosis not present

## 2019-08-08 DIAGNOSIS — M9901 Segmental and somatic dysfunction of cervical region: Secondary | ICD-10-CM | POA: Diagnosis not present

## 2019-08-09 DIAGNOSIS — M5412 Radiculopathy, cervical region: Secondary | ICD-10-CM | POA: Diagnosis not present

## 2019-08-09 DIAGNOSIS — M9901 Segmental and somatic dysfunction of cervical region: Secondary | ICD-10-CM | POA: Diagnosis not present

## 2019-08-17 DIAGNOSIS — M9901 Segmental and somatic dysfunction of cervical region: Secondary | ICD-10-CM | POA: Diagnosis not present

## 2019-08-17 DIAGNOSIS — M5412 Radiculopathy, cervical region: Secondary | ICD-10-CM | POA: Diagnosis not present

## 2019-08-23 DIAGNOSIS — M5412 Radiculopathy, cervical region: Secondary | ICD-10-CM | POA: Diagnosis not present

## 2019-08-23 DIAGNOSIS — M9901 Segmental and somatic dysfunction of cervical region: Secondary | ICD-10-CM | POA: Diagnosis not present

## 2019-08-23 DIAGNOSIS — G894 Chronic pain syndrome: Secondary | ICD-10-CM | POA: Diagnosis not present

## 2019-11-15 IMAGING — DX DG CHEST 2V
2 series · 2 of 2 positions shown · non-contrast
Comparison: 03/18/2017 chest CT

CLINICAL DATA: 52 y/o  M; chest pain starting tonight at midnight.

EXAM:
CHEST - 2 VIEW

[chest pa]
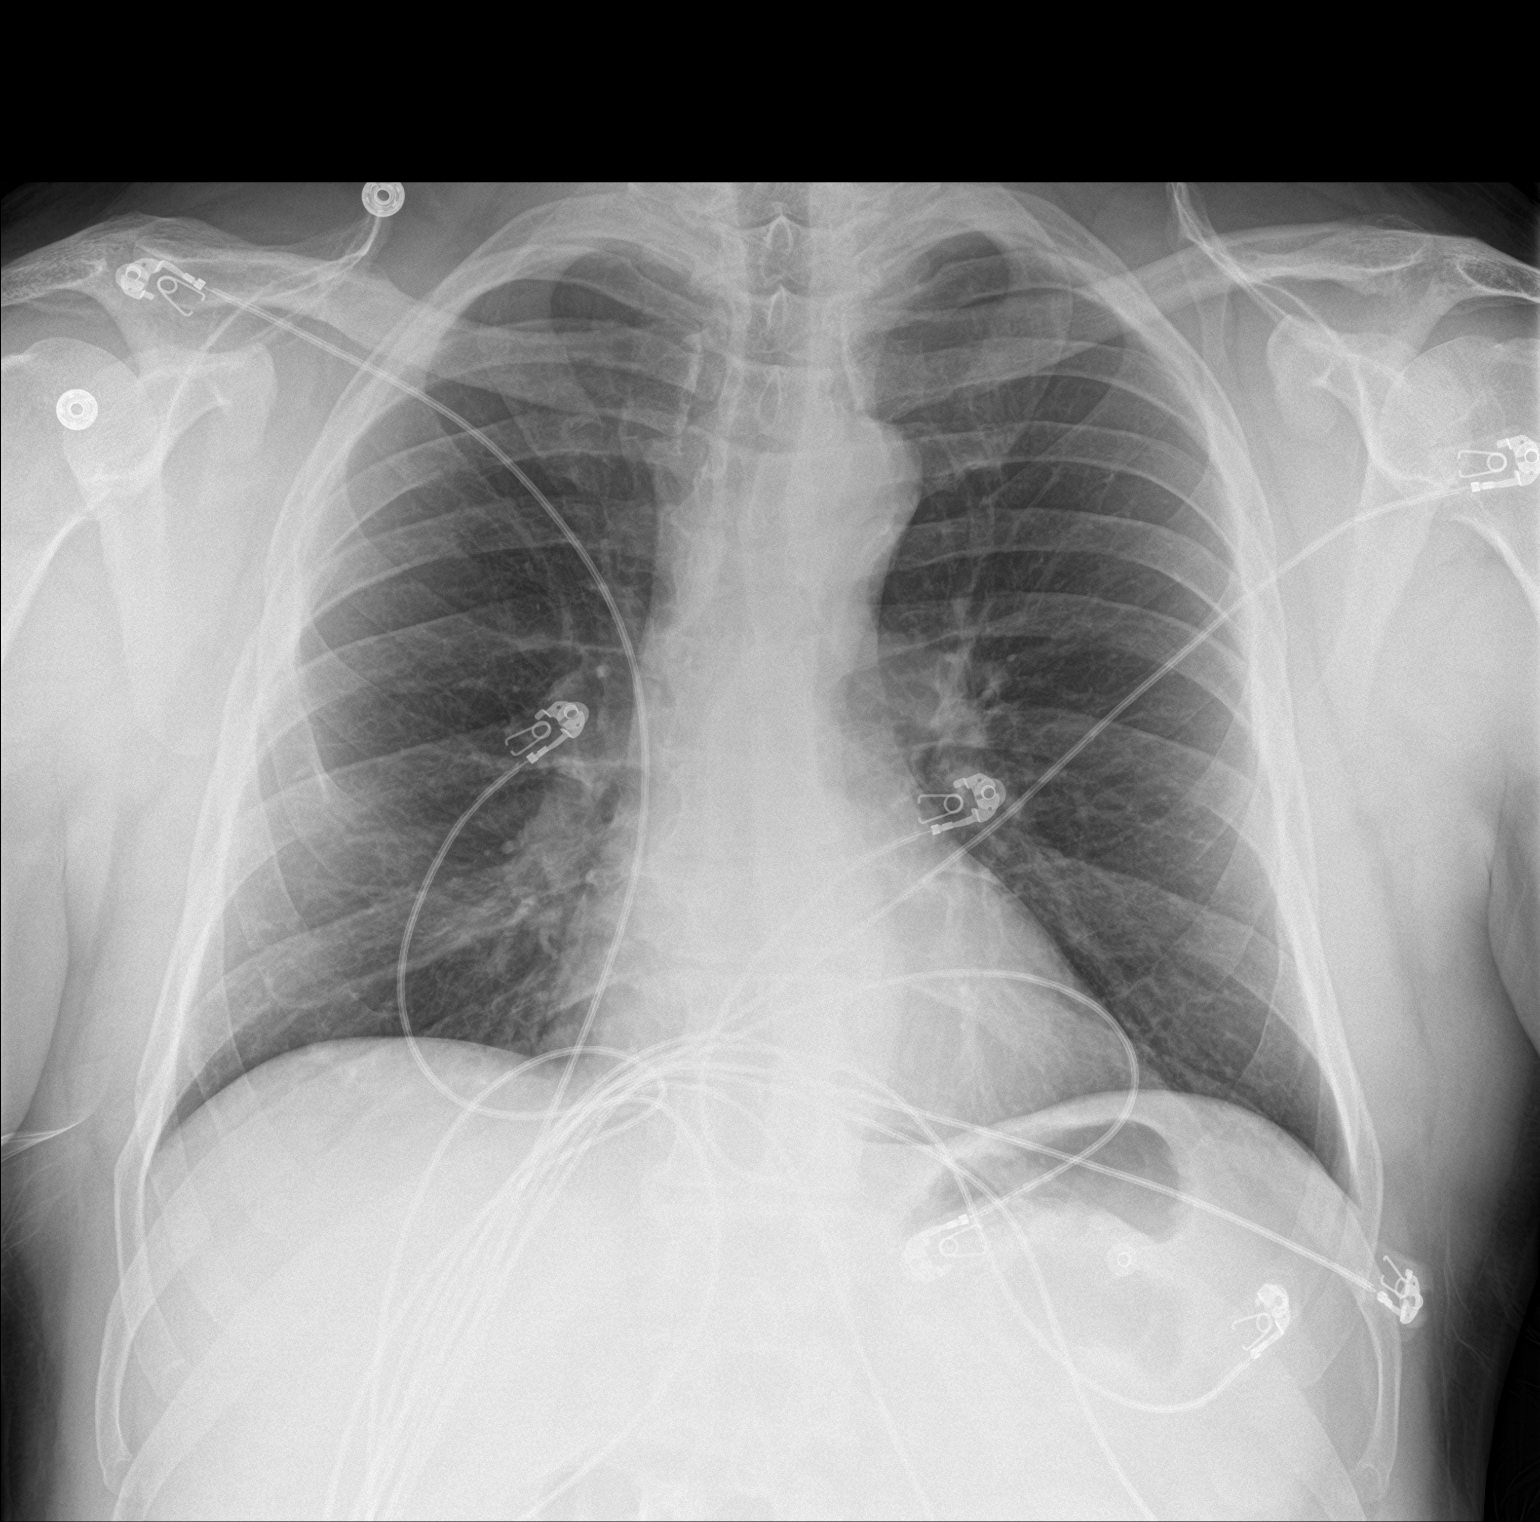

[chest lat]
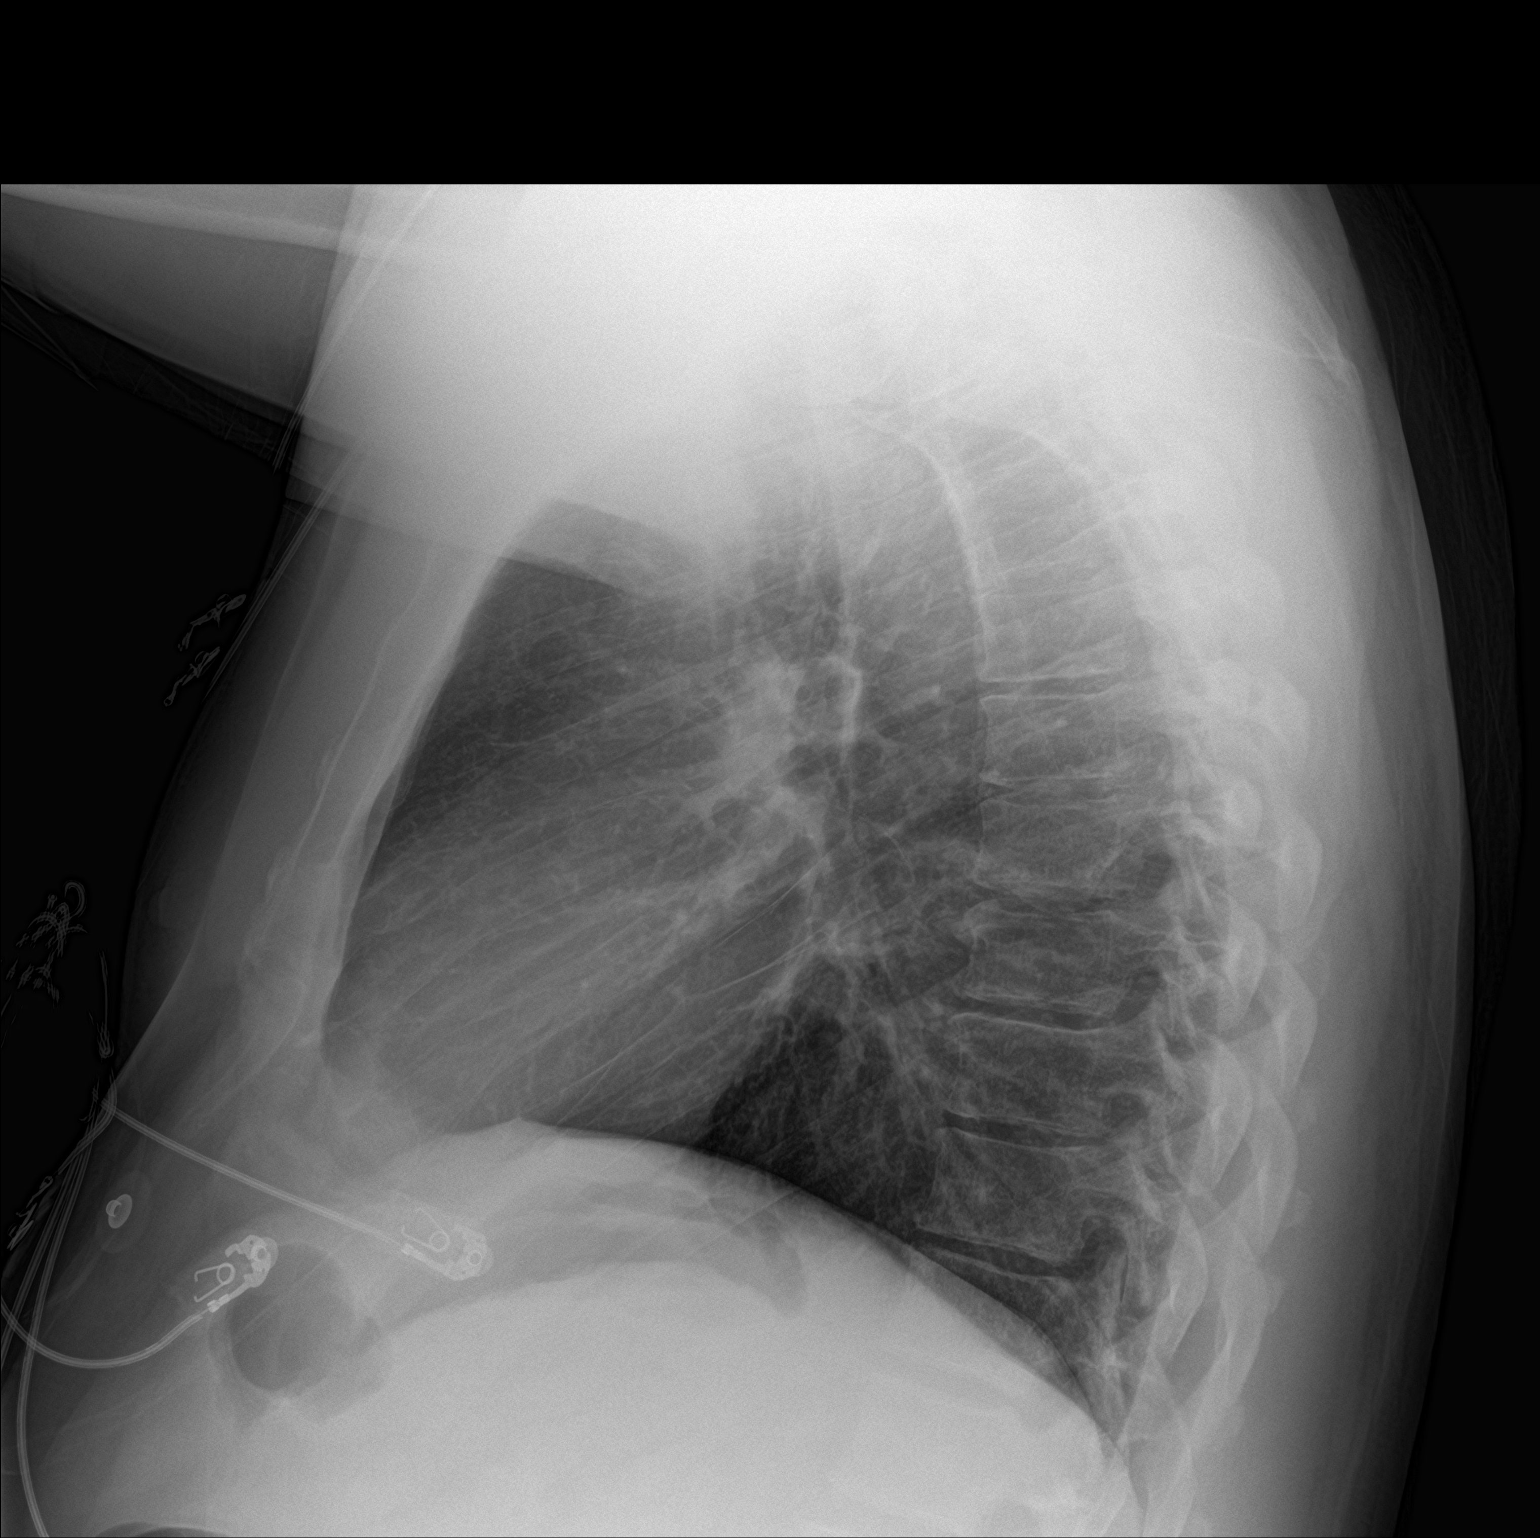

[2 of 2 positions shown; findings below may reference images not displayed]

FINDINGS: The heart size and mediastinal contours are within normal limits.
Both lungs are clear. Mild S-shaped curvature of the spine. No acute
osseous abnormality is evident.
IMPRESSION: No acute pulmonary process identified.

By: [place] M.D.

## 2019-11-23 DIAGNOSIS — I10 Essential (primary) hypertension: Secondary | ICD-10-CM | POA: Diagnosis not present

## 2019-11-23 DIAGNOSIS — G894 Chronic pain syndrome: Secondary | ICD-10-CM | POA: Diagnosis not present

## 2020-01-25 DIAGNOSIS — M5412 Radiculopathy, cervical region: Secondary | ICD-10-CM | POA: Diagnosis not present

## 2020-01-25 DIAGNOSIS — M9901 Segmental and somatic dysfunction of cervical region: Secondary | ICD-10-CM | POA: Diagnosis not present

## 2020-01-31 DIAGNOSIS — M9901 Segmental and somatic dysfunction of cervical region: Secondary | ICD-10-CM | POA: Diagnosis not present

## 2020-01-31 DIAGNOSIS — M5412 Radiculopathy, cervical region: Secondary | ICD-10-CM | POA: Diagnosis not present

## 2020-02-02 DIAGNOSIS — M9901 Segmental and somatic dysfunction of cervical region: Secondary | ICD-10-CM | POA: Diagnosis not present

## 2020-02-02 DIAGNOSIS — M5412 Radiculopathy, cervical region: Secondary | ICD-10-CM | POA: Diagnosis not present

## 2020-02-15 DIAGNOSIS — I10 Essential (primary) hypertension: Secondary | ICD-10-CM | POA: Diagnosis not present

## 2020-02-15 DIAGNOSIS — G894 Chronic pain syndrome: Secondary | ICD-10-CM | POA: Diagnosis not present

## 2020-05-16 DIAGNOSIS — G894 Chronic pain syndrome: Secondary | ICD-10-CM | POA: Diagnosis not present

## 2020-05-16 DIAGNOSIS — I10 Essential (primary) hypertension: Secondary | ICD-10-CM | POA: Diagnosis not present

## 2020-05-16 DIAGNOSIS — M545 Low back pain: Secondary | ICD-10-CM | POA: Diagnosis not present

## 2020-08-10 DIAGNOSIS — J3489 Other specified disorders of nose and nasal sinuses: Secondary | ICD-10-CM | POA: Diagnosis not present

## 2020-08-10 DIAGNOSIS — G894 Chronic pain syndrome: Secondary | ICD-10-CM | POA: Diagnosis not present

## 2020-08-30 DIAGNOSIS — R0981 Nasal congestion: Secondary | ICD-10-CM | POA: Diagnosis not present

## 2020-11-12 DIAGNOSIS — E782 Mixed hyperlipidemia: Secondary | ICD-10-CM | POA: Diagnosis not present

## 2020-11-12 DIAGNOSIS — Z0000 Encounter for general adult medical examination without abnormal findings: Secondary | ICD-10-CM | POA: Diagnosis not present

## 2020-11-12 DIAGNOSIS — I10 Essential (primary) hypertension: Secondary | ICD-10-CM | POA: Diagnosis not present

## 2020-11-12 DIAGNOSIS — Z125 Encounter for screening for malignant neoplasm of prostate: Secondary | ICD-10-CM | POA: Diagnosis not present

## 2021-02-19 DIAGNOSIS — F332 Major depressive disorder, recurrent severe without psychotic features: Secondary | ICD-10-CM | POA: Diagnosis not present

## 2021-02-19 DIAGNOSIS — I10 Essential (primary) hypertension: Secondary | ICD-10-CM | POA: Diagnosis not present

## 2021-02-19 DIAGNOSIS — G8929 Other chronic pain: Secondary | ICD-10-CM | POA: Diagnosis not present

## 2021-02-19 DIAGNOSIS — E782 Mixed hyperlipidemia: Secondary | ICD-10-CM | POA: Diagnosis not present

## 2021-05-13 DIAGNOSIS — G8929 Other chronic pain: Secondary | ICD-10-CM | POA: Diagnosis not present

## 2021-05-13 DIAGNOSIS — M5412 Radiculopathy, cervical region: Secondary | ICD-10-CM | POA: Diagnosis not present

## 2021-05-13 DIAGNOSIS — I10 Essential (primary) hypertension: Secondary | ICD-10-CM | POA: Diagnosis not present

## 2021-05-13 DIAGNOSIS — L309 Dermatitis, unspecified: Secondary | ICD-10-CM | POA: Diagnosis not present

## 2021-08-20 DIAGNOSIS — M545 Low back pain, unspecified: Secondary | ICD-10-CM | POA: Diagnosis not present

## 2021-08-20 DIAGNOSIS — M542 Cervicalgia: Secondary | ICD-10-CM | POA: Diagnosis not present

## 2021-09-12 DIAGNOSIS — I10 Essential (primary) hypertension: Secondary | ICD-10-CM | POA: Diagnosis not present

## 2021-09-12 DIAGNOSIS — M5412 Radiculopathy, cervical region: Secondary | ICD-10-CM | POA: Diagnosis not present

## 2021-09-12 DIAGNOSIS — G8929 Other chronic pain: Secondary | ICD-10-CM | POA: Diagnosis not present

## 2021-09-17 DIAGNOSIS — M5412 Radiculopathy, cervical region: Secondary | ICD-10-CM | POA: Diagnosis not present

## 2021-10-02 DIAGNOSIS — I10 Essential (primary) hypertension: Secondary | ICD-10-CM | POA: Diagnosis not present

## 2021-10-02 DIAGNOSIS — R519 Headache, unspecified: Secondary | ICD-10-CM | POA: Diagnosis not present

## 2021-11-11 DIAGNOSIS — I10 Essential (primary) hypertension: Secondary | ICD-10-CM | POA: Diagnosis not present

## 2021-11-11 DIAGNOSIS — K625 Hemorrhage of anus and rectum: Secondary | ICD-10-CM | POA: Diagnosis not present

## 2022-01-02 DIAGNOSIS — I700 Atherosclerosis of aorta: Secondary | ICD-10-CM | POA: Diagnosis not present

## 2022-01-02 DIAGNOSIS — N200 Calculus of kidney: Secondary | ICD-10-CM | POA: Diagnosis not present

## 2022-01-02 DIAGNOSIS — K625 Hemorrhage of anus and rectum: Secondary | ICD-10-CM | POA: Diagnosis not present

## 2022-01-08 DIAGNOSIS — K625 Hemorrhage of anus and rectum: Secondary | ICD-10-CM | POA: Diagnosis not present

## 2022-01-08 DIAGNOSIS — Z86010 Personal history of colonic polyps: Secondary | ICD-10-CM | POA: Diagnosis not present

## 2022-01-08 DIAGNOSIS — R198 Other specified symptoms and signs involving the digestive system and abdomen: Secondary | ICD-10-CM | POA: Diagnosis not present

## 2022-01-13 DIAGNOSIS — K625 Hemorrhage of anus and rectum: Secondary | ICD-10-CM | POA: Diagnosis not present

## 2022-01-13 DIAGNOSIS — K648 Other hemorrhoids: Secondary | ICD-10-CM | POA: Diagnosis not present

## 2022-01-13 DIAGNOSIS — D128 Benign neoplasm of rectum: Secondary | ICD-10-CM | POA: Diagnosis not present

## 2022-01-13 DIAGNOSIS — Z86010 Personal history of colonic polyps: Secondary | ICD-10-CM | POA: Diagnosis not present

## 2022-01-13 DIAGNOSIS — R198 Other specified symptoms and signs involving the digestive system and abdomen: Secondary | ICD-10-CM | POA: Diagnosis not present

## 2022-01-13 DIAGNOSIS — K5730 Diverticulosis of large intestine without perforation or abscess without bleeding: Secondary | ICD-10-CM | POA: Diagnosis not present

## 2022-01-13 DIAGNOSIS — K621 Rectal polyp: Secondary | ICD-10-CM | POA: Diagnosis not present

## 2022-03-13 DIAGNOSIS — I10 Essential (primary) hypertension: Secondary | ICD-10-CM | POA: Diagnosis not present

## 2022-07-17 DIAGNOSIS — R7303 Prediabetes: Secondary | ICD-10-CM | POA: Diagnosis not present

## 2022-07-17 DIAGNOSIS — G4739 Other sleep apnea: Secondary | ICD-10-CM | POA: Diagnosis not present

## 2022-07-17 DIAGNOSIS — F325 Major depressive disorder, single episode, in full remission: Secondary | ICD-10-CM | POA: Diagnosis not present

## 2022-07-17 DIAGNOSIS — I10 Essential (primary) hypertension: Secondary | ICD-10-CM | POA: Diagnosis not present

## 2022-07-29 DIAGNOSIS — F418 Other specified anxiety disorders: Secondary | ICD-10-CM | POA: Diagnosis not present

## 2022-09-22 DIAGNOSIS — F418 Other specified anxiety disorders: Secondary | ICD-10-CM | POA: Diagnosis not present

## 2022-09-22 DIAGNOSIS — E782 Mixed hyperlipidemia: Secondary | ICD-10-CM | POA: Diagnosis not present

## 2022-11-12 DIAGNOSIS — Z0000 Encounter for general adult medical examination without abnormal findings: Secondary | ICD-10-CM | POA: Diagnosis not present

## 2022-11-19 DIAGNOSIS — Z125 Encounter for screening for malignant neoplasm of prostate: Secondary | ICD-10-CM | POA: Diagnosis not present

## 2022-11-19 DIAGNOSIS — R7303 Prediabetes: Secondary | ICD-10-CM | POA: Diagnosis not present

## 2022-11-19 DIAGNOSIS — I10 Essential (primary) hypertension: Secondary | ICD-10-CM | POA: Diagnosis not present

## 2022-11-19 DIAGNOSIS — E782 Mixed hyperlipidemia: Secondary | ICD-10-CM | POA: Diagnosis not present

## 2022-12-12 DIAGNOSIS — G894 Chronic pain syndrome: Secondary | ICD-10-CM | POA: Diagnosis not present

## 2022-12-12 DIAGNOSIS — F418 Other specified anxiety disorders: Secondary | ICD-10-CM | POA: Diagnosis not present

## 2022-12-12 DIAGNOSIS — I10 Essential (primary) hypertension: Secondary | ICD-10-CM | POA: Diagnosis not present

## 2022-12-12 DIAGNOSIS — F411 Generalized anxiety disorder: Secondary | ICD-10-CM | POA: Diagnosis not present

## 2023-03-10 DIAGNOSIS — R7303 Prediabetes: Secondary | ICD-10-CM | POA: Diagnosis not present

## 2023-03-10 DIAGNOSIS — G894 Chronic pain syndrome: Secondary | ICD-10-CM | POA: Diagnosis not present

## 2023-03-10 DIAGNOSIS — I10 Essential (primary) hypertension: Secondary | ICD-10-CM | POA: Diagnosis not present

## 2023-03-10 DIAGNOSIS — E782 Mixed hyperlipidemia: Secondary | ICD-10-CM | POA: Diagnosis not present

## 2023-03-10 DIAGNOSIS — F325 Major depressive disorder, single episode, in full remission: Secondary | ICD-10-CM | POA: Diagnosis not present

## 2023-05-19 DIAGNOSIS — L03032 Cellulitis of left toe: Secondary | ICD-10-CM | POA: Diagnosis not present

## 2023-05-20 DIAGNOSIS — L03032 Cellulitis of left toe: Secondary | ICD-10-CM | POA: Diagnosis not present

## 2023-06-15 DIAGNOSIS — F411 Generalized anxiety disorder: Secondary | ICD-10-CM | POA: Diagnosis not present

## 2023-06-15 DIAGNOSIS — G894 Chronic pain syndrome: Secondary | ICD-10-CM | POA: Diagnosis not present

## 2023-06-15 DIAGNOSIS — E782 Mixed hyperlipidemia: Secondary | ICD-10-CM | POA: Diagnosis not present

## 2023-06-15 DIAGNOSIS — I10 Essential (primary) hypertension: Secondary | ICD-10-CM | POA: Diagnosis not present

## 2023-07-14 DIAGNOSIS — I10 Essential (primary) hypertension: Secondary | ICD-10-CM | POA: Diagnosis not present

## 2023-07-14 DIAGNOSIS — F5104 Psychophysiologic insomnia: Secondary | ICD-10-CM | POA: Diagnosis not present

## 2023-08-18 DIAGNOSIS — M545 Low back pain, unspecified: Secondary | ICD-10-CM | POA: Diagnosis not present

## 2023-09-23 DIAGNOSIS — R7303 Prediabetes: Secondary | ICD-10-CM | POA: Diagnosis not present

## 2023-09-23 DIAGNOSIS — I10 Essential (primary) hypertension: Secondary | ICD-10-CM | POA: Diagnosis not present

## 2023-09-23 DIAGNOSIS — Z125 Encounter for screening for malignant neoplasm of prostate: Secondary | ICD-10-CM | POA: Diagnosis not present

## 2023-09-23 DIAGNOSIS — E782 Mixed hyperlipidemia: Secondary | ICD-10-CM | POA: Diagnosis not present

## 2023-09-29 DIAGNOSIS — F5104 Psychophysiologic insomnia: Secondary | ICD-10-CM | POA: Diagnosis not present

## 2023-09-29 DIAGNOSIS — Z0000 Encounter for general adult medical examination without abnormal findings: Secondary | ICD-10-CM | POA: Diagnosis not present

## 2023-09-29 DIAGNOSIS — I10 Essential (primary) hypertension: Secondary | ICD-10-CM | POA: Diagnosis not present

## 2023-09-29 DIAGNOSIS — F411 Generalized anxiety disorder: Secondary | ICD-10-CM | POA: Diagnosis not present

## 2023-09-29 DIAGNOSIS — G894 Chronic pain syndrome: Secondary | ICD-10-CM | POA: Diagnosis not present

## 2023-12-16 DIAGNOSIS — F411 Generalized anxiety disorder: Secondary | ICD-10-CM | POA: Diagnosis not present

## 2023-12-16 DIAGNOSIS — F331 Major depressive disorder, recurrent, moderate: Secondary | ICD-10-CM | POA: Diagnosis not present

## 2023-12-16 DIAGNOSIS — G894 Chronic pain syndrome: Secondary | ICD-10-CM | POA: Diagnosis not present

## 2024-01-20 DIAGNOSIS — F418 Other specified anxiety disorders: Secondary | ICD-10-CM | POA: Diagnosis not present

## 2024-01-20 DIAGNOSIS — E119 Type 2 diabetes mellitus without complications: Secondary | ICD-10-CM | POA: Diagnosis not present

## 2024-01-20 DIAGNOSIS — G894 Chronic pain syndrome: Secondary | ICD-10-CM | POA: Diagnosis not present

## 2024-03-28 DIAGNOSIS — I10 Essential (primary) hypertension: Secondary | ICD-10-CM | POA: Diagnosis not present

## 2024-03-28 DIAGNOSIS — R42 Dizziness and giddiness: Secondary | ICD-10-CM | POA: Diagnosis not present

## 2024-05-18 DIAGNOSIS — G894 Chronic pain syndrome: Secondary | ICD-10-CM | POA: Diagnosis not present

## 2024-05-18 DIAGNOSIS — E119 Type 2 diabetes mellitus without complications: Secondary | ICD-10-CM | POA: Diagnosis not present

## 2024-05-18 DIAGNOSIS — F418 Other specified anxiety disorders: Secondary | ICD-10-CM | POA: Diagnosis not present

## 2024-07-12 DIAGNOSIS — F418 Other specified anxiety disorders: Secondary | ICD-10-CM | POA: Diagnosis not present

## 2024-07-12 DIAGNOSIS — N529 Male erectile dysfunction, unspecified: Secondary | ICD-10-CM | POA: Diagnosis not present

## 2024-07-13 DIAGNOSIS — F418 Other specified anxiety disorders: Secondary | ICD-10-CM | POA: Diagnosis not present

## 2024-08-02 DIAGNOSIS — M25559 Pain in unspecified hip: Secondary | ICD-10-CM | POA: Diagnosis not present

## 2024-08-02 DIAGNOSIS — F332 Major depressive disorder, recurrent severe without psychotic features: Secondary | ICD-10-CM | POA: Diagnosis not present

## 2024-08-02 DIAGNOSIS — Z6831 Body mass index (BMI) 31.0-31.9, adult: Secondary | ICD-10-CM | POA: Diagnosis not present

## 2024-08-12 DIAGNOSIS — M1611 Unilateral primary osteoarthritis, right hip: Secondary | ICD-10-CM | POA: Diagnosis not present

## 2024-08-12 DIAGNOSIS — M25551 Pain in right hip: Secondary | ICD-10-CM | POA: Diagnosis not present

## 2024-08-12 DIAGNOSIS — M5416 Radiculopathy, lumbar region: Secondary | ICD-10-CM | POA: Diagnosis not present

## 2024-08-12 DIAGNOSIS — M5431 Sciatica, right side: Secondary | ICD-10-CM | POA: Diagnosis not present

## 2024-09-09 DIAGNOSIS — M5416 Radiculopathy, lumbar region: Secondary | ICD-10-CM | POA: Diagnosis not present

## 2024-09-09 DIAGNOSIS — M1611 Unilateral primary osteoarthritis, right hip: Secondary | ICD-10-CM | POA: Diagnosis not present

## 2024-09-09 DIAGNOSIS — M5431 Sciatica, right side: Secondary | ICD-10-CM | POA: Diagnosis not present

## 2024-09-12 DIAGNOSIS — M1611 Unilateral primary osteoarthritis, right hip: Secondary | ICD-10-CM | POA: Diagnosis not present

## 2024-10-12 DIAGNOSIS — M5416 Radiculopathy, lumbar region: Secondary | ICD-10-CM | POA: Diagnosis not present

## 2024-10-12 DIAGNOSIS — M1611 Unilateral primary osteoarthritis, right hip: Secondary | ICD-10-CM | POA: Diagnosis not present

## 2024-10-12 DIAGNOSIS — M5459 Other low back pain: Secondary | ICD-10-CM | POA: Diagnosis not present

## 2024-10-12 DIAGNOSIS — M5431 Sciatica, right side: Secondary | ICD-10-CM | POA: Diagnosis not present

## 2024-10-28 DIAGNOSIS — G894 Chronic pain syndrome: Secondary | ICD-10-CM | POA: Diagnosis not present

## 2024-10-28 DIAGNOSIS — F332 Major depressive disorder, recurrent severe without psychotic features: Secondary | ICD-10-CM | POA: Diagnosis not present

## 2024-11-18 DIAGNOSIS — Z125 Encounter for screening for malignant neoplasm of prostate: Secondary | ICD-10-CM | POA: Diagnosis not present

## 2024-11-18 DIAGNOSIS — I10 Essential (primary) hypertension: Secondary | ICD-10-CM | POA: Diagnosis not present

## 2024-11-18 DIAGNOSIS — F5104 Psychophysiologic insomnia: Secondary | ICD-10-CM | POA: Diagnosis not present

## 2024-11-18 DIAGNOSIS — G894 Chronic pain syndrome: Secondary | ICD-10-CM | POA: Diagnosis not present

## 2024-11-18 DIAGNOSIS — Z0000 Encounter for general adult medical examination without abnormal findings: Secondary | ICD-10-CM | POA: Diagnosis not present

## 2024-11-18 DIAGNOSIS — E782 Mixed hyperlipidemia: Secondary | ICD-10-CM | POA: Diagnosis not present
